# Patient Record
Sex: Female | Born: 1970 | Race: White | Hispanic: No | Smoking: Never smoker
Health system: Southern US, Community
[De-identification: ages and names within clinical notes are randomized; demographics above are authoritative.]

## PROBLEM LIST (undated history)

## (undated) DIAGNOSIS — C50919 Malignant neoplasm of unspecified site of unspecified female breast: Secondary | ICD-10-CM

## (undated) DIAGNOSIS — E119 Type 2 diabetes mellitus without complications: Secondary | ICD-10-CM

## (undated) HISTORY — PX: THYROIDECTOMY: SHX17

## (undated) HISTORY — DX: Type 2 diabetes mellitus without complications: E11.9

## (undated) HISTORY — DX: Malignant neoplasm of unspecified site of unspecified female breast: C50.919

---

## 1998-09-30 ENCOUNTER — Inpatient Hospital Stay (HOSPITAL_COMMUNITY): Admission: AD | Admit: 1998-09-30 | Discharge: 1998-09-30 | Payer: Self-pay | Admitting: Obstetrics and Gynecology

## 1998-10-01 ENCOUNTER — Inpatient Hospital Stay (HOSPITAL_COMMUNITY): Admission: AD | Admit: 1998-10-01 | Discharge: 1998-10-01 | Payer: Self-pay | Admitting: *Deleted

## 1999-02-20 ENCOUNTER — Inpatient Hospital Stay (HOSPITAL_COMMUNITY): Admission: AD | Admit: 1999-02-20 | Discharge: 1999-02-20 | Payer: Self-pay | Admitting: Obstetrics and Gynecology

## 1999-03-15 ENCOUNTER — Inpatient Hospital Stay (HOSPITAL_COMMUNITY): Admission: AD | Admit: 1999-03-15 | Discharge: 1999-03-17 | Payer: Self-pay | Admitting: Obstetrics and Gynecology

## 1999-05-28 ENCOUNTER — Other Ambulatory Visit: Admission: RE | Admit: 1999-05-28 | Discharge: 1999-05-28 | Payer: Self-pay | Admitting: Obstetrics and Gynecology

## 1999-05-28 ENCOUNTER — Encounter (INDEPENDENT_AMBULATORY_CARE_PROVIDER_SITE_OTHER): Payer: Self-pay | Admitting: Specialist

## 2000-01-22 ENCOUNTER — Other Ambulatory Visit: Admission: RE | Admit: 2000-01-22 | Discharge: 2000-01-22 | Payer: Self-pay | Admitting: Obstetrics and Gynecology

## 2000-03-01 ENCOUNTER — Emergency Department (HOSPITAL_COMMUNITY): Admission: EM | Admit: 2000-03-01 | Discharge: 2000-03-01 | Payer: Self-pay | Admitting: Emergency Medicine

## 2000-07-14 ENCOUNTER — Encounter: Payer: Self-pay | Admitting: Obstetrics & Gynecology

## 2000-07-14 ENCOUNTER — Inpatient Hospital Stay (HOSPITAL_COMMUNITY): Admission: AD | Admit: 2000-07-14 | Discharge: 2000-07-14 | Payer: Self-pay | Admitting: Obstetrics & Gynecology

## 2000-08-09 ENCOUNTER — Inpatient Hospital Stay (HOSPITAL_COMMUNITY): Admission: AD | Admit: 2000-08-09 | Discharge: 2000-08-09 | Payer: Self-pay | Admitting: Obstetrics and Gynecology

## 2000-08-15 ENCOUNTER — Inpatient Hospital Stay (HOSPITAL_COMMUNITY): Admission: AD | Admit: 2000-08-15 | Discharge: 2000-08-15 | Payer: Self-pay | Admitting: Obstetrics and Gynecology

## 2000-08-28 ENCOUNTER — Inpatient Hospital Stay (HOSPITAL_COMMUNITY): Admission: AD | Admit: 2000-08-28 | Discharge: 2000-08-30 | Payer: Self-pay | Admitting: Obstetrics and Gynecology

## 2001-02-03 ENCOUNTER — Other Ambulatory Visit: Admission: RE | Admit: 2001-02-03 | Discharge: 2001-02-03 | Payer: Self-pay | Admitting: Obstetrics and Gynecology

## 2001-02-28 ENCOUNTER — Ambulatory Visit (HOSPITAL_COMMUNITY): Admission: RE | Admit: 2001-02-28 | Discharge: 2001-02-28 | Payer: Self-pay | Admitting: Gastroenterology

## 2002-01-05 ENCOUNTER — Other Ambulatory Visit: Admission: RE | Admit: 2002-01-05 | Discharge: 2002-01-05 | Payer: Self-pay | Admitting: Obstetrics and Gynecology

## 2002-11-05 ENCOUNTER — Other Ambulatory Visit: Admission: RE | Admit: 2002-11-05 | Discharge: 2002-11-05 | Payer: Self-pay | Admitting: Obstetrics and Gynecology

## 2003-05-05 ENCOUNTER — Encounter: Payer: Self-pay | Admitting: *Deleted

## 2003-05-05 ENCOUNTER — Inpatient Hospital Stay (HOSPITAL_COMMUNITY): Admission: AD | Admit: 2003-05-05 | Discharge: 2003-05-05 | Payer: Self-pay | Admitting: Obstetrics and Gynecology

## 2003-05-27 ENCOUNTER — Inpatient Hospital Stay (HOSPITAL_COMMUNITY): Admission: AD | Admit: 2003-05-27 | Discharge: 2003-05-29 | Payer: Self-pay | Admitting: *Deleted

## 2003-06-05 ENCOUNTER — Ambulatory Visit (HOSPITAL_COMMUNITY): Admission: RE | Admit: 2003-06-05 | Discharge: 2003-06-05 | Payer: Self-pay | Admitting: Obstetrics and Gynecology

## 2003-06-05 ENCOUNTER — Encounter (INDEPENDENT_AMBULATORY_CARE_PROVIDER_SITE_OTHER): Payer: Self-pay | Admitting: Specialist

## 2003-07-01 ENCOUNTER — Other Ambulatory Visit: Admission: RE | Admit: 2003-07-01 | Discharge: 2003-07-01 | Payer: Self-pay | Admitting: Obstetrics and Gynecology

## 2003-09-23 ENCOUNTER — Encounter: Admission: RE | Admit: 2003-09-23 | Discharge: 2003-09-23 | Payer: Self-pay | Admitting: Obstetrics and Gynecology

## 2003-12-02 ENCOUNTER — Encounter: Admission: RE | Admit: 2003-12-02 | Discharge: 2003-12-02 | Payer: Self-pay | Admitting: Obstetrics and Gynecology

## 2005-01-04 ENCOUNTER — Encounter: Admission: RE | Admit: 2005-01-04 | Discharge: 2005-01-04 | Payer: Self-pay | Admitting: Obstetrics and Gynecology

## 2006-01-17 ENCOUNTER — Encounter: Admission: RE | Admit: 2006-01-17 | Discharge: 2006-01-17 | Payer: Self-pay | Admitting: Obstetrics and Gynecology

## 2006-01-19 ENCOUNTER — Encounter: Admission: RE | Admit: 2006-01-19 | Discharge: 2006-01-19 | Payer: Self-pay | Admitting: Obstetrics and Gynecology

## 2006-02-14 ENCOUNTER — Encounter: Admission: RE | Admit: 2006-02-14 | Discharge: 2006-02-14 | Payer: Self-pay | Admitting: Obstetrics and Gynecology

## 2007-05-09 ENCOUNTER — Encounter: Admission: RE | Admit: 2007-05-09 | Discharge: 2007-05-09 | Payer: Self-pay | Admitting: Obstetrics and Gynecology

## 2007-05-19 ENCOUNTER — Encounter: Admission: RE | Admit: 2007-05-19 | Discharge: 2007-05-19 | Payer: Self-pay | Admitting: Obstetrics and Gynecology

## 2007-06-02 ENCOUNTER — Encounter (INDEPENDENT_AMBULATORY_CARE_PROVIDER_SITE_OTHER): Payer: Self-pay | Admitting: Diagnostic Radiology

## 2007-06-02 ENCOUNTER — Encounter: Admission: RE | Admit: 2007-06-02 | Discharge: 2007-06-02 | Payer: Self-pay | Admitting: Obstetrics and Gynecology

## 2007-06-05 HISTORY — PX: BREAST BIOPSY: SHX20

## 2008-01-15 ENCOUNTER — Encounter: Admission: RE | Admit: 2008-01-15 | Discharge: 2008-01-15 | Payer: Self-pay | Admitting: Obstetrics and Gynecology

## 2008-06-05 ENCOUNTER — Encounter: Admission: RE | Admit: 2008-06-05 | Discharge: 2008-06-05 | Payer: Self-pay | Admitting: Obstetrics and Gynecology

## 2009-06-06 ENCOUNTER — Encounter: Admission: RE | Admit: 2009-06-06 | Discharge: 2009-06-06 | Payer: Self-pay | Admitting: Obstetrics and Gynecology

## 2009-06-10 ENCOUNTER — Encounter: Admission: RE | Admit: 2009-06-10 | Discharge: 2009-06-10 | Payer: Self-pay | Admitting: Obstetrics and Gynecology

## 2010-06-28 HISTORY — PX: BREAST BIOPSY: SHX20

## 2010-06-29 ENCOUNTER — Encounter: Admission: RE | Admit: 2010-06-29 | Discharge: 2010-06-29 | Payer: Self-pay | Admitting: Obstetrics and Gynecology

## 2010-07-01 ENCOUNTER — Encounter: Admission: RE | Admit: 2010-07-01 | Discharge: 2010-07-01 | Payer: Self-pay | Admitting: Obstetrics and Gynecology

## 2010-11-08 ENCOUNTER — Encounter: Payer: Self-pay | Admitting: Obstetrics and Gynecology

## 2010-11-09 ENCOUNTER — Encounter: Payer: Self-pay | Admitting: Obstetrics and Gynecology

## 2011-01-29 ENCOUNTER — Other Ambulatory Visit: Payer: Self-pay | Admitting: Obstetrics and Gynecology

## 2011-01-29 ENCOUNTER — Other Ambulatory Visit: Payer: Self-pay | Admitting: Gynecology

## 2011-01-29 DIAGNOSIS — Z803 Family history of malignant neoplasm of breast: Secondary | ICD-10-CM

## 2011-01-29 DIAGNOSIS — Z9889 Other specified postprocedural states: Secondary | ICD-10-CM

## 2011-03-05 NOTE — Procedures (Signed)
[place]. [facility]  Patient:    Victoria Garza, Victoria Garza                    MRN: [id number] Proc. Date: 02/28/01 Adm. Date:  [id number] Attending:  Charna Elizabeth CC:         Sheronette A. Cherly Hensen, M.D.   Procedure Report  DATE OF BIRTH:  [birth year]  PROCEDURE PERFORMED:  Flexible sigmoidoscopy up to 60 cm.  ENDOSCOPIST:  Anselmo Rod, M.D.  INSTRUMENT USED:  Olympus video colonoscope.  INDICATION FOR PROCEDURE:  A 40 year old white female with a history of rectal bleeding.  Rule out proctitis or left-sided colitis.  PREPROCEDURE PREPARATION:  Informed consent was procured from the patient. The patient was fasting for eight hours prior to the procedure and prepped with two Fleets enemas the morning of the procedure.  PREPROCEDURE PHYSICAL:  VITAL SIGNS:  Stable.  NECK:  Supple.  CHEST:  Clear to auscultation.   S1, S2 regular.  ABDOMEN:  Soft with normal abdominal bowel sounds.  DESCRIPTION OF THE PROCEDURE:  The patient was placed in the left lateral decubitus position and sedated with 20 mg of Demerol and 3 mg of Versed intravenous.  Once the patient was adequately sedated and maintained on low-flow oxygen and continuous cardiac monitoring, the Olympus video colonoscope was advanced to the rectum to 60 cm without difficulty except for a small internal hemorrhoid.  No other abnormalities were appreciated.  The patient tolerated the procedure well without complications.  The entire colonic mucosa and the rectum and left colon appeared normal and without lesions.  IMPRESSION:  Normal flexible sigmoidoscopy up to 60 cm except for small internal hemorrhoids.  RECOMMENDATIONS:  The patient has been advised to increase the fluid and fiber in diet and follow up in the office in the next two weeks. DD:  02/28/01 TD:  02/28/01 Job: [id number]

## 2011-03-05 NOTE — Op Note (Signed)
NAME:  Victoria Garza, Victoria Garza                       ACCOUNT NO.:  [id number]   MEDICAL RECORD NO.:  [id number]                   PATIENT TYPE:  AMB   LOCATION:  SDC                                  FACILITY:  [facility]   PHYSICIAN:  Maxie Better, M.D.            DATE OF BIRTH:  [birth year]   DATE OF PROCEDURE:  06/05/2003  DATE OF DISCHARGE:                                 OPERATIVE REPORT   PREOPERATIVE DIAGNOSES:  1. Hematometra.  2. Rule out retained placental tissue.   PROCEDURE:  Suction dilation and curettage.   POSTOPERATIVE DIAGNOSES:  1. Hematometra.  2. Rule out retained placental tissue.   ANESTHESIA:  MAC, paracervical block.   SURGEON:  Maxie Better, M.D.   INDICATIONS:  This is a 40 year old gravida 3, para 3, female, who is status  post uncomplicated vaginal delivery nine days ago, who presented two days  ago to the office with complaint of a temperature of 101.5 and who on  further evaluation today was found to have an endometrial cavity that was  filled with probable blood.  The patient now presents for surgical  evaluation.  She had been placed on Keflex on June 03, 2003, secondary to  puerperal fever of unknown etiology.  Urinalysis had been negative at that  time.  The patient's uterine exam was nontender.  Her breasts were engorged  without any erythema.  There was no focal finding.  Her white count was  mildly increased at 11.5.  The patient reports not still feeling any better  and still having intermittent fever; therefore, an ultrasound was performed  which then revealed the above findings and the necessity for surgical  intervention.  She had had minimal bleeding since her delivery.  Risks and  benefits of the procedure had been explained to the patient.  Two grams of  intravenous Ancef were started.  The patient was transferred to the  operating room.   DESCRIPTION OF PROCEDURE:  Under adequate monitored anesthesia, the patient  was placed in  the dorsal lithotomy position.  She was sterilely prepped and  draped in the usual fashion.  The bladder was catheterized of a small amount  of urine.  Bimanual examination revealed about a 12-week anteverted uterus,  fingertip dilated cervical os, no adnexal masses could be appreciated.  A  bivalve speculum was placed in the vagina, 20 mL of 1% Nesacaine was  injected paracervically at 3 and 9 o'clock.  The anterior lip of the cervix  was grasped with a ring clamp.  The cervix easily accepted a #29 Pratt  dilator and a #10 curved suction cannula was introduced into the uterine  cavity.  Prior to placing the cannula, there was brick-colored bloody fluid  suggestive of possibly blood mixed with pus; however, there was no odor to  this fluid.  The cavity was suctioned, curetted, and suctioned.  When the  uterine cavity was felt to have not had  any evidence of any tissue, all  instruments were then removed from the vagina.   SPECIMENS:  The endometrial curettings sent to pathology.   ESTIMATED BLOOD LOSS:  Minimal.    COMPLICATIONS:  None.   The patient tolerated the procedure well, was transferred to the recovery  room in stable condition.                                                Maxie Better, M.D.    [place]/MEDQ  D:  06/05/2003  T:  06/06/2003  Job:  [id number]

## 2011-06-24 ENCOUNTER — Other Ambulatory Visit: Payer: Self-pay | Admitting: Obstetrics and Gynecology

## 2011-06-24 DIAGNOSIS — Z1231 Encounter for screening mammogram for malignant neoplasm of breast: Secondary | ICD-10-CM

## 2011-07-21 ENCOUNTER — Ambulatory Visit: Payer: Self-pay

## 2011-08-04 ENCOUNTER — Ambulatory Visit: Payer: Self-pay

## 2011-08-11 ENCOUNTER — Ambulatory Visit
Admission: RE | Admit: 2011-08-11 | Discharge: 2011-08-11 | Disposition: A | Discharge status: Home / Self Care | Payer: PRIVATE HEALTH INSURANCE | Source: Ambulatory Visit | Attending: Obstetrics and Gynecology | Admitting: Obstetrics and Gynecology

## 2011-08-11 DIAGNOSIS — Z1231 Encounter for screening mammogram for malignant neoplasm of breast: Secondary | ICD-10-CM

## 2012-07-10 ENCOUNTER — Other Ambulatory Visit: Payer: Self-pay | Admitting: Obstetrics and Gynecology

## 2012-07-10 DIAGNOSIS — Z1231 Encounter for screening mammogram for malignant neoplasm of breast: Secondary | ICD-10-CM

## 2012-08-14 ENCOUNTER — Ambulatory Visit: Payer: PRIVATE HEALTH INSURANCE

## 2012-10-26 ENCOUNTER — Ambulatory Visit: Payer: PRIVATE HEALTH INSURANCE

## 2012-11-30 ENCOUNTER — Ambulatory Visit: Payer: PRIVATE HEALTH INSURANCE

## 2012-12-21 ENCOUNTER — Ambulatory Visit: Payer: PRIVATE HEALTH INSURANCE

## 2013-03-05 ENCOUNTER — Other Ambulatory Visit: Payer: Self-pay | Admitting: Family Medicine

## 2013-03-05 DIAGNOSIS — N644 Mastodynia: Secondary | ICD-10-CM

## 2013-03-19 ENCOUNTER — Ambulatory Visit
Admission: RE | Admit: 2013-03-19 | Discharge: 2013-03-19 | Disposition: A | Discharge status: Home / Self Care | Payer: PRIVATE HEALTH INSURANCE | Source: Ambulatory Visit | Attending: Family Medicine | Admitting: Family Medicine

## 2013-03-19 DIAGNOSIS — N644 Mastodynia: Secondary | ICD-10-CM

## 2013-03-27 ENCOUNTER — Other Ambulatory Visit: Payer: Self-pay | Admitting: Obstetrics and Gynecology

## 2013-03-27 DIAGNOSIS — Z803 Family history of malignant neoplasm of breast: Secondary | ICD-10-CM

## 2013-04-04 ENCOUNTER — Other Ambulatory Visit: Payer: PRIVATE HEALTH INSURANCE

## 2014-05-03 ENCOUNTER — Other Ambulatory Visit: Payer: Self-pay

## 2014-05-03 DIAGNOSIS — Z1231 Encounter for screening mammogram for malignant neoplasm of breast: Secondary | ICD-10-CM

## 2014-06-21 ENCOUNTER — Ambulatory Visit: Payer: PRIVATE HEALTH INSURANCE

## 2014-07-11 ENCOUNTER — Ambulatory Visit
Admission: RE | Admit: 2014-07-11 | Discharge: 2014-07-11 | Disposition: A | Discharge status: Home / Self Care | Payer: No Typology Code available for payment source | Source: Ambulatory Visit

## 2014-07-11 ENCOUNTER — Encounter (INDEPENDENT_AMBULATORY_CARE_PROVIDER_SITE_OTHER): Payer: Self-pay

## 2014-07-11 DIAGNOSIS — Z1231 Encounter for screening mammogram for malignant neoplasm of breast: Secondary | ICD-10-CM

## 2015-07-28 ENCOUNTER — Other Ambulatory Visit: Payer: Self-pay

## 2015-07-28 DIAGNOSIS — Z1231 Encounter for screening mammogram for malignant neoplasm of breast: Secondary | ICD-10-CM

## 2015-08-27 ENCOUNTER — Ambulatory Visit: Payer: No Typology Code available for payment source

## 2017-12-08 ENCOUNTER — Other Ambulatory Visit: Payer: Self-pay | Admitting: Obstetrics and Gynecology

## 2017-12-08 DIAGNOSIS — N6489 Other specified disorders of breast: Secondary | ICD-10-CM

## 2017-12-09 ENCOUNTER — Ambulatory Visit
Admission: RE | Admit: 2017-12-09 | Discharge: 2017-12-09 | Disposition: A | Discharge status: Home / Self Care | Payer: BLUE CROSS/BLUE SHIELD | Source: Ambulatory Visit | Attending: Obstetrics and Gynecology | Admitting: Obstetrics and Gynecology

## 2017-12-09 DIAGNOSIS — N6489 Other specified disorders of breast: Secondary | ICD-10-CM

## 2018-06-23 ENCOUNTER — Emergency Department (HOSPITAL_BASED_OUTPATIENT_CLINIC_OR_DEPARTMENT_OTHER)
Admission: EM | Admit: 2018-06-23 | Discharge: 2018-06-23 | Disposition: A | Discharge status: Other Acute Inpatient Hospital | Payer: BLUE CROSS/BLUE SHIELD | Attending: Emergency Medicine | Admitting: Emergency Medicine

## 2018-06-23 ENCOUNTER — Emergency Department (HOSPITAL_BASED_OUTPATIENT_CLINIC_OR_DEPARTMENT_OTHER): Payer: BLUE CROSS/BLUE SHIELD

## 2018-06-23 ENCOUNTER — Encounter (HOSPITAL_BASED_OUTPATIENT_CLINIC_OR_DEPARTMENT_OTHER): Payer: Self-pay | Admitting: Emergency Medicine

## 2018-06-23 ENCOUNTER — Other Ambulatory Visit: Payer: Self-pay

## 2018-06-23 DIAGNOSIS — R079 Chest pain, unspecified: Secondary | ICD-10-CM | POA: Diagnosis not present

## 2018-06-23 DIAGNOSIS — M542 Cervicalgia: Secondary | ICD-10-CM | POA: Insufficient documentation

## 2018-06-23 DIAGNOSIS — R42 Dizziness and giddiness: Secondary | ICD-10-CM | POA: Insufficient documentation

## 2018-06-23 DIAGNOSIS — R142 Eructation: Secondary | ICD-10-CM | POA: Insufficient documentation

## 2018-06-23 DIAGNOSIS — R110 Nausea: Secondary | ICD-10-CM | POA: Diagnosis not present

## 2018-06-23 DIAGNOSIS — R200 Anesthesia of skin: Secondary | ICD-10-CM | POA: Insufficient documentation

## 2018-06-23 LAB — COMPREHENSIVE METABOLIC PANEL
ALBUMIN: 4.4 g/dL (ref 3.5–5.0)
ALK PHOS: 38 U/L (ref 38–126)
ALT: 14 U/L (ref 0–44)
ANION GAP: 10 (ref 5–15)
AST: 20 U/L (ref 15–41)
BUN: 16 mg/dL (ref 6–20)
CALCIUM: 8.9 mg/dL (ref 8.9–10.3)
CO2: 23 mmol/L (ref 22–32)
Chloride: 105 mmol/L (ref 98–111)
Creatinine, Ser: 0.78 mg/dL (ref 0.44–1.00)
GFR calc Af Amer: >60 mL/min (ref >60)
GFR calc non Af Amer: >60 mL/min (ref >60)
GLUCOSE: 93 mg/dL (ref 70–99)
Potassium: 3.8 mmol/L (ref 3.5–5.1)
SODIUM: 138 mmol/L (ref 135–145)
Total Bilirubin: 0.9 mg/dL (ref 0.3–1.2)
Total Protein: 7.6 g/dL (ref 6.5–8.1)

## 2018-06-23 LAB — CBC WITH DIFFERENTIAL/PLATELET
BASOS ABS: 0.1 10*3/uL (ref 0.0–0.1)
BASOS PCT: 1 %
EOS ABS: 0.2 10*3/uL (ref 0.0–0.7)
Eosinophils Relative: 2 %
HEMATOCRIT: 41.8 % (ref 36.0–46.0)
HEMOGLOBIN: 14.2 g/dL (ref 12.0–15.0)
Lymphocytes Relative: 29 %
Lymphs Abs: 2 10*3/uL (ref 0.7–4.0)
MCH: 30.4 pg (ref 26.0–34.0)
MCHC: 34 g/dL (ref 30.0–36.0)
MCV: 89.5 fL (ref 78.0–100.0)
Monocytes Absolute: 0.7 10*3/uL (ref 0.1–1.0)
Monocytes Relative: 11 %
NEUTROS ABS: 3.9 10*3/uL (ref 1.7–7.7)
NEUTROS PCT: 57 %
Platelets: 272 10*3/uL (ref 150–400)
RBC: 4.67 MIL/uL (ref 3.87–5.11)
RDW: 12.7 % (ref 11.5–15.5)
WBC: 6.8 10*3/uL (ref 4.0–10.5)

## 2018-06-23 LAB — TROPONIN I
Troponin I: <0.03 ng/mL (ref <0.03)
Troponin I: <0.03 ng/mL (ref <0.03)

## 2018-06-23 LAB — PREGNANCY, URINE: Preg Test, Ur: NEGATIVE

## 2018-06-23 MED ORDER — ACETAMINOPHEN 500 MG PO TABS
1000.0000 mg | ORAL_TABLET | Freq: Once | ORAL | Status: AC
  Administered 2018-06-23: 1000 mg via ORAL
  Filled 2018-06-23: qty 2

## 2018-06-23 MED ORDER — NITROGLYCERIN 0.4 MG SL SUBL
0.4000 mg | SUBLINGUAL_TABLET | SUBLINGUAL | Status: DC | PRN
  Administered 2018-06-23 (×5): 0.4 mg via SUBLINGUAL
  Filled 2018-06-23 (×2): qty 1

## 2018-06-23 MED ORDER — ASPIRIN 81 MG PO CHEW
324.0000 mg | CHEWABLE_TABLET | Freq: Once | ORAL | Status: AC
  Administered 2018-06-23: 324 mg via ORAL
  Filled 2018-06-23: qty 4

## 2018-06-23 NOTE — ED Provider Notes (Signed)
[facility] EMERGENCY DEPARTMENT Provider Note   CSN: [id number] Arrival date & time: 06/23/18  1043     History   Chief Complaint Chief Complaint  Patient presents with  . Chest Pain    HPI Victoria Garza is a 47 y.o. female.  HPI   47 year old female presents with concern for chest pain. Reports feels like a weight on her chest.  Has been over the last month, increasing in frequency, more consistent, had some additional symptoms. Left arm felt numb a few weeks ago, arm was propped up at the time. Felt some tightness in the neck.  Heart history in family.  Last night felt sharp pain laying down, was feeling more like pressure and ball like pain.  Was sitting in video conference and felt like big ball in the chest with radiation to the neck and tongue felt heavy, and felt lightheaded.  Reports walking on Sunday without CP at that time, hasn't noticed any specific exertional pain or pain after eating.  Reports belching but it doesn't help.  Never had hx of acid reflux or indigestion.  No shortness of breath.  Nausea now but did not before. No diaphoresis.  No syncope.  No leg pain or swelling.  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  No hx htn, chol, DM, no smoking, no other drugs Dad had MI around 59s  History reviewed. No pertinent past medical history.  There are no active problems to display for this patient.   Past Surgical History:  Procedure Laterality Date  . BREAST BIOPSY Left 06/05/2007  . BREAST BIOPSY Right 06/28/2010     OB History   None      Home Medications    Prior to Admission medications   Not on File    Family History Family History  Problem Relation Age of Onset  . Breast cancer Sister 27    Social History Social History   Tobacco Use  . Smoking status: Not on file  Substance Use Topics  . Alcohol use: Not on file  . Drug use: Not on file     Allergies   Patient has no known allergies.   Review of  Systems Review of Systems  Constitutional: Negative for fever.  HENT: Negative for sore throat.   Eyes: Negative for visual disturbance.  Respiratory: Negative for cough and shortness of breath.   Cardiovascular: Positive for chest pain.  Gastrointestinal: Positive for nausea. Negative for abdominal pain, diarrhea and vomiting.  Genitourinary: Negative for difficulty urinating.  Musculoskeletal: Negative for back pain and neck pain.  Skin: Negative for rash.  Neurological: Negative for syncope, facial asymmetry, speech difficulty, weakness, numbness and headaches.     Physical Exam Updated Vital Signs BP 113/80 (BP Location: Right Arm)   Pulse 89   Temp 98.6 F (37 C) (Oral)   Resp 16   Ht 5\' 2"  (1.575 m)   Wt 55.3 kg   LMP 06/02/2018 (Approximate)   SpO2 99%   BMI 22.31 kg/m   Physical Exam  Constitutional: She is oriented to person, place, and time. She appears well-developed and well-nourished. No distress.  HENT:  Head: Normocephalic and atraumatic.  Eyes: Conjunctivae and EOM are normal.  Neck: Normal range of motion.  Cardiovascular: Normal rate, regular rhythm, normal heart sounds and intact distal pulses. Exam reveals no gallop and no friction rub.  No murmur heard. Pulmonary/Chest: Effort normal and breath sounds normal. No respiratory distress. She has  no wheezes. She has no rales.  Abdominal: Soft. She exhibits no distension. There is no tenderness. There is no guarding.  Musculoskeletal: She exhibits no edema or tenderness.  Neurological: She is alert and oriented to person, place, and time. She has normal strength. No cranial nerve deficit or sensory deficit. Coordination normal. GCS eye subscore is 4. GCS verbal subscore is 5. GCS motor subscore is 6.  Skin: Skin is warm and dry. No rash noted. She is not diaphoretic. No erythema.  Nursing note and vitals reviewed.    ED Treatments / Results  Labs (all labs ordered are listed, but only abnormal results  are displayed) Labs Reviewed  CBC WITH DIFFERENTIAL/PLATELET  COMPREHENSIVE METABOLIC PANEL  PREGNANCY, URINE  TROPONIN I  TROPONIN I    EKG EKG Interpretation  Date/Time:  Friday June 23 2018 13:48:15 EDT Ventricular Rate:  75 PR Interval:    QRS Duration: 82 QT Interval:  389 QTC Calculation: 435 R Axis:   47 Text Interpretation:  Sinus rhythm Abnormal R-wave progression, early transition No significant change since last tracing Confirmed by Alvira Monday ([id number]) on 06/23/2018 2:35:27 PM   Radiology Dg Chest 2 View  Result Date: 06/23/2018 CLINICAL DATA:  Intermittent chest pain. EXAM: CHEST - 2 VIEW COMPARISON:  No prior. FINDINGS: Mediastinum Hilar structures normal. Lungs are clear. No pleural effusion or pneumothorax. Heart size normal. No acute bony abnormality. IMPRESSION: No acute cardiopulmonary disease. Electronically Signed   By: Maisie Fus  Register   On: 06/23/2018 11:45    Procedures Procedures (including critical care time)  Medications Ordered in ED Medications  nitroGLYCERIN (NITROSTAT) SL tablet 0.4 mg (0.4 mg Sublingual Given 06/23/18 1312)  acetaminophen (TYLENOL) tablet 1,000 mg (1,000 mg Oral Given 06/23/18 1138)  aspirin chewable tablet 324 mg (324 mg Oral Given 06/23/18 1140)     Initial Impression / Assessment and Plan / ED Course  I have reviewed the triage vital signs and the nursing notes.  Pertinent labs & imaging results that were available during my care of the patient were reviewed by me and considered in my medical decision making (see chart for details).     46 year old female with family history of coronary artery disease presents with concern for chest pain radiating to the neck and jaw.  EKG shows abnormal R wave progression, without other acute abnormalities, and is unchanged on repeat.  Troponin negative x2.  Have low suspicion for pulmonary embolus given patient denies shortness of breath, is not on estrogen, no tachycardia, no  tachypnea, no asymmetric leg swelling, and has not had any long flights or travel.  Doubt dissection given normal blood pressures, normal pulses in the upper and lower extremities. Reported bilateral tongue sensation changes, doubt CVA, normal neuro exam.   Patient's chest pain was relieved after receiving 3 sublingual nitro. Given ASA.  Her heart score is 4-5 given historical factors and family history.  Given this, I offered admission.  Patient prefers admission to [facility], accepted to hospitalist and Cardiology to consult.  Final Clinical Impressions(s) / ED Diagnoses   Final diagnoses:  Chest pain, unspecified type    ED Discharge Orders    None       Alvira Monday, MD 06/23/18 1733

## 2018-06-23 NOTE — ED Notes (Signed)
Denies chest pain at present.  States she will let staff know if she begins to have CP.  Nitro held.

## 2018-06-23 NOTE — ED Triage Notes (Addendum)
Reports intermittent chest pain x 1 month. Reports that feels like a weight is on her chest but that she occasionally has periods of sharp pain.  Reports that she was flying last night and the pain was very heavy across the chest.  Denies shortness of breath but sits for extended periods.  Denies nausea and vomiting.  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 Reports episode today approximately 20 minutes PTA of right side of jaw and tongue numbness.  States that this "flushed to my legs and I became really weak and felt like I was going to pass out".

## 2018-06-23 NOTE — ED Notes (Signed)
Patient states pain 1/10; states it's more of a "casual pain" now.

## 2018-06-23 NOTE — ED Notes (Signed)
ED Provider at bedside. 

## 2018-10-08 IMAGING — CR DG CHEST 2V
2 series · 2 of 2 positions shown · non-contrast
Comparison: No prior.

CLINICAL DATA: Intermittent chest pain.

EXAM:
CHEST - 2 VIEW

[w chest pa]
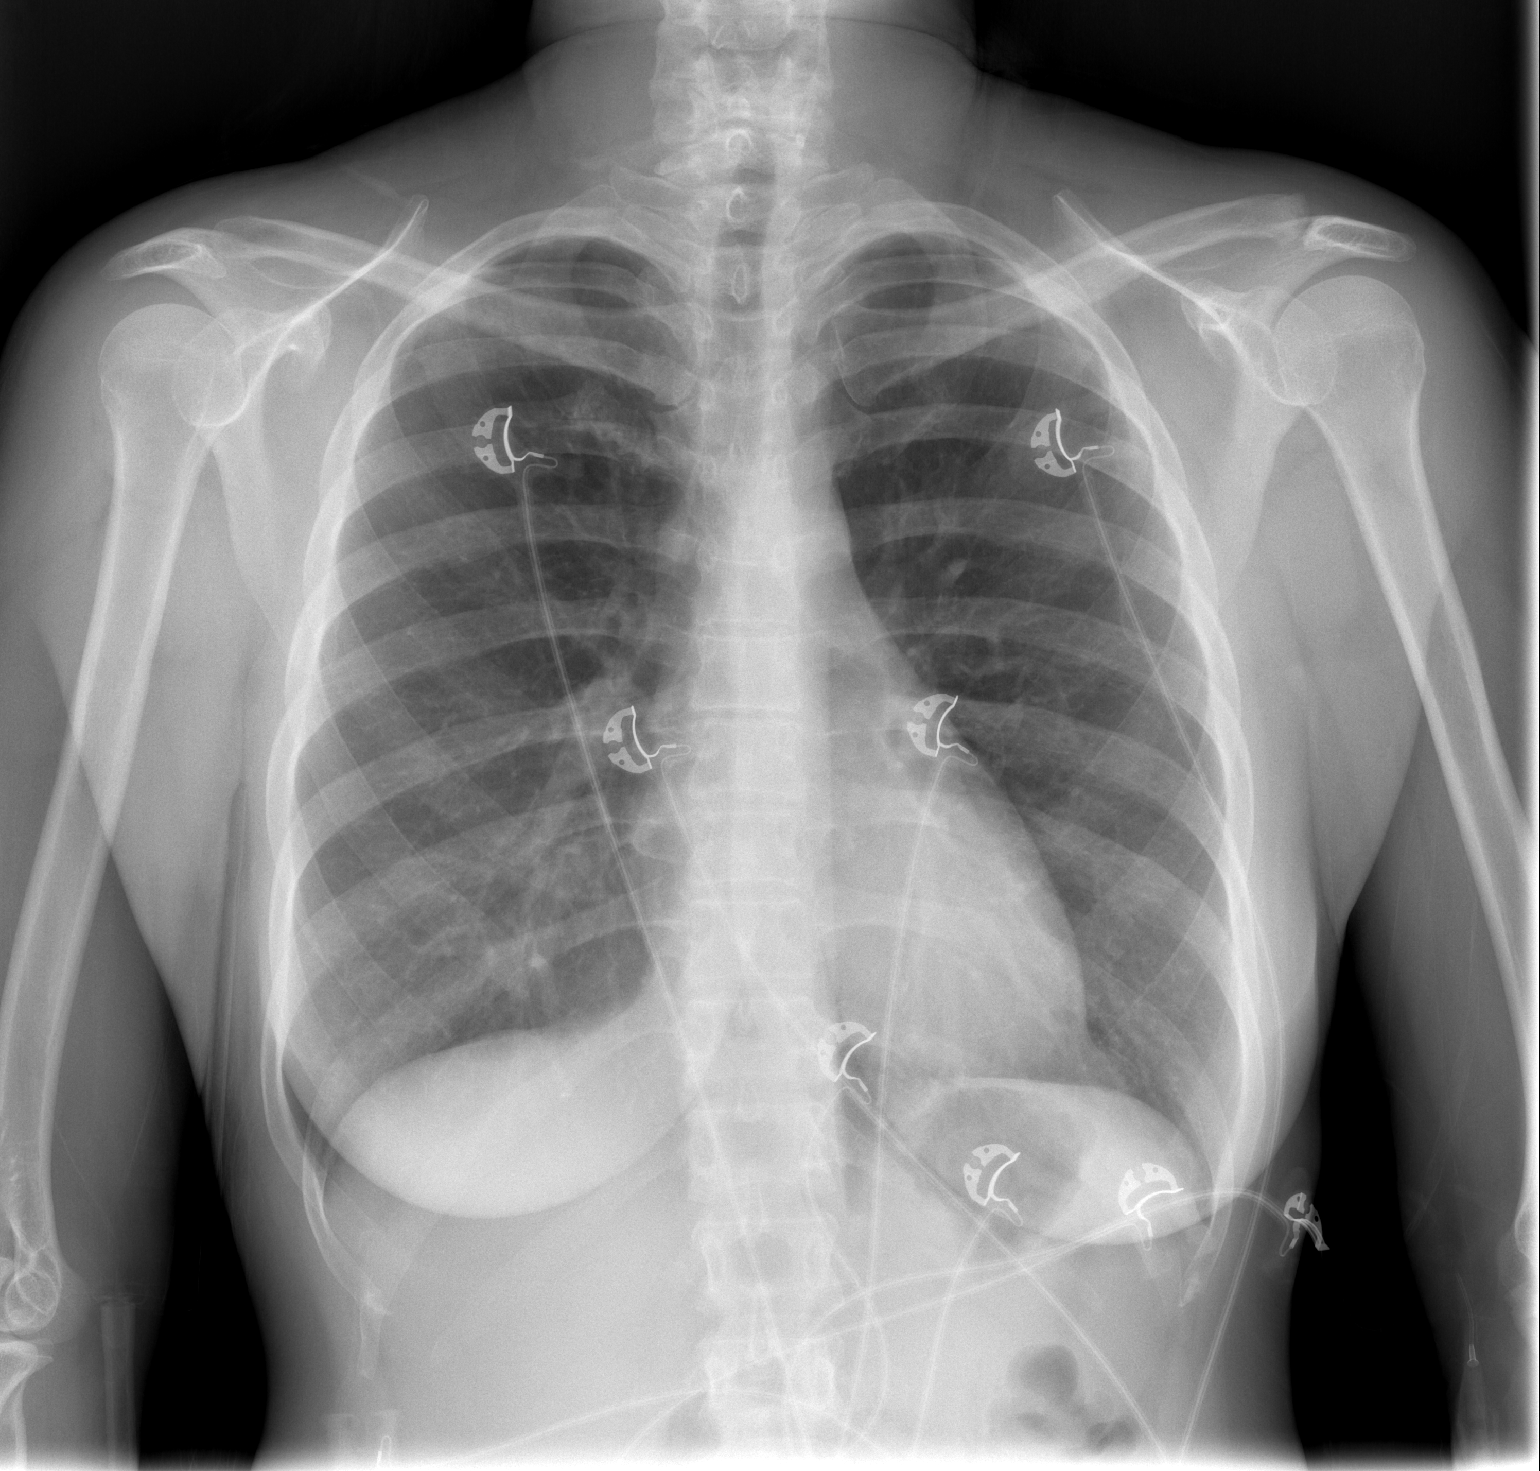

[w chest lat]
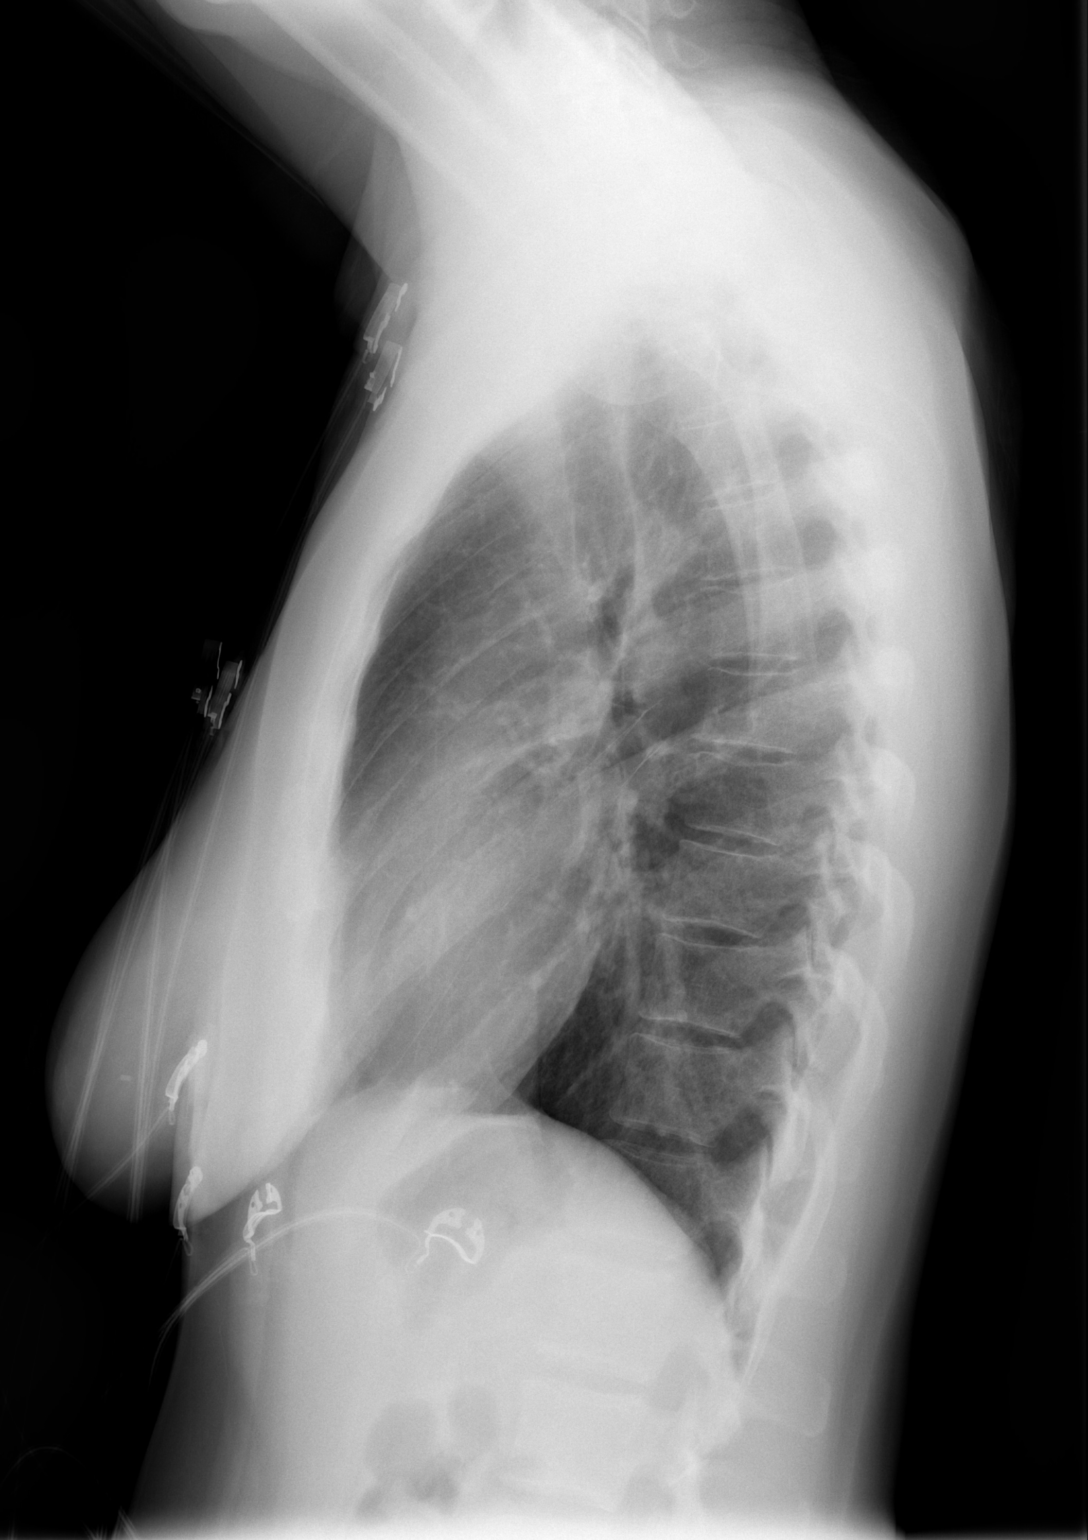

[2 of 2 positions shown; findings below may reference images not displayed]

FINDINGS: Mediastinum

Hilar structures normal. Lungs are clear. No pleural effusion or
pneumothorax. Heart size normal. No acute bony abnormality.
IMPRESSION: No acute cardiopulmonary disease.

## 2023-05-07 LAB — COLOGUARD: COLOGUARD: NEGATIVE

## 2023-07-20 ENCOUNTER — Other Ambulatory Visit: Payer: Self-pay

## 2023-07-22 LAB — SURGICAL PATHOLOGY

## 2023-07-25 ENCOUNTER — Encounter: Payer: Self-pay | Admitting: Genetic Counselor

## 2023-07-25 ENCOUNTER — Encounter: Payer: Self-pay | Admitting: *Deleted

## 2023-07-25 ENCOUNTER — Telehealth: Payer: Self-pay | Admitting: *Deleted

## 2023-07-25 DIAGNOSIS — Z1379 Encounter for other screening for genetic and chromosomal anomalies: Secondary | ICD-10-CM | POA: Insufficient documentation

## 2023-07-25 NOTE — Telephone Encounter (Signed)
Spoke with patient to confirm [facility] appt for 07/27/23 at 12:15pm. Information given along with contact info.

## 2023-07-26 ENCOUNTER — Other Ambulatory Visit: Payer: Self-pay | Admitting: *Deleted

## 2023-07-26 DIAGNOSIS — C50411 Malignant neoplasm of upper-outer quadrant of right female breast: Secondary | ICD-10-CM | POA: Insufficient documentation

## 2023-07-27 ENCOUNTER — Inpatient Hospital Stay (HOSPITAL_BASED_OUTPATIENT_CLINIC_OR_DEPARTMENT_OTHER): Payer: 59 | Admitting: Hematology and Oncology

## 2023-07-27 ENCOUNTER — Other Ambulatory Visit: Payer: Self-pay | Admitting: General Surgery

## 2023-07-27 ENCOUNTER — Encounter: Payer: Self-pay | Admitting: Hematology and Oncology

## 2023-07-27 ENCOUNTER — Inpatient Hospital Stay: Payer: 59 | Attending: Hematology and Oncology

## 2023-07-27 ENCOUNTER — Other Ambulatory Visit: Payer: Self-pay

## 2023-07-27 ENCOUNTER — Encounter: Payer: Self-pay | Admitting: General Practice

## 2023-07-27 ENCOUNTER — Ambulatory Visit: Payer: 59 | Attending: General Surgery | Admitting: Physical Therapy

## 2023-07-27 ENCOUNTER — Ambulatory Visit
Admission: RE | Admit: 2023-07-27 | Discharge: 2023-07-27 | Disposition: A | Discharge status: Home / Self Care | Payer: BLUE CROSS/BLUE SHIELD | Source: Ambulatory Visit | Attending: Radiation Oncology | Admitting: Radiation Oncology

## 2023-07-27 ENCOUNTER — Encounter: Payer: Self-pay | Admitting: Physical Therapy

## 2023-07-27 VITALS — BP 140/66 | HR 83 | Temp 97.7°F | Resp 18 | Ht 62.0 in | Wt 157.9 lb

## 2023-07-27 DIAGNOSIS — C50411 Malignant neoplasm of upper-outer quadrant of right female breast: Secondary | ICD-10-CM

## 2023-07-27 DIAGNOSIS — Z170 Estrogen receptor positive status [ER+]: Secondary | ICD-10-CM

## 2023-07-27 DIAGNOSIS — R293 Abnormal posture: Secondary | ICD-10-CM | POA: Diagnosis present

## 2023-07-27 LAB — CBC WITH DIFFERENTIAL (CANCER CENTER ONLY)
Abs Immature Granulocytes: 0.03 10*3/uL (ref 0.00–0.07)
Basophils Absolute: 0.1 10*3/uL (ref 0.0–0.1)
Basophils Relative: 1 %
Eosinophils Absolute: 0.2 10*3/uL (ref 0.0–0.5)
Eosinophils Relative: 3 %
HCT: 42.9 % (ref 36.0–46.0)
Hemoglobin: 14.5 g/dL (ref 12.0–15.0)
Immature Granulocytes: 0 %
Lymphocytes Relative: 22 %
Lymphs Abs: 1.7 10*3/uL (ref 0.7–4.0)
MCH: 30.1 pg (ref 26.0–34.0)
MCHC: 33.8 g/dL (ref 30.0–36.0)
MCV: 89.2 fL (ref 80.0–100.0)
Monocytes Absolute: 0.7 10*3/uL (ref 0.1–1.0)
Monocytes Relative: 9 %
Neutro Abs: 5 10*3/uL (ref 1.7–7.7)
Neutrophils Relative %: 65 %
Platelet Count: 339 10*3/uL (ref 150–400)
RBC: 4.81 MIL/uL (ref 3.87–5.11)
RDW: 12.3 % (ref 11.5–15.5)
WBC Count: 7.6 10*3/uL (ref 4.0–10.5)
nRBC: 0 % (ref 0.0–0.2)

## 2023-07-27 LAB — CMP (CANCER CENTER ONLY)
ALT: 15 U/L (ref 0–44)
AST: 18 U/L (ref 15–41)
Albumin: 4.5 g/dL (ref 3.5–5.0)
Alkaline Phosphatase: 62 U/L (ref 38–126)
Anion gap: 6 (ref 5–15)
BUN: 14 mg/dL (ref 6–20)
CO2: 30 mmol/L (ref 22–32)
Calcium: 9.4 mg/dL (ref 8.9–10.3)
Chloride: 104 mmol/L (ref 98–111)
Creatinine: 0.99 mg/dL (ref 0.44–1.00)
GFR, Estimated: >60 mL/min (ref >60)
Glucose, Bld: 108 mg/dL — ABNORMAL HIGH (ref 70–99)
Potassium: 3.6 mmol/L (ref 3.5–5.1)
Sodium: 140 mmol/L (ref 135–145)
Total Bilirubin: 0.5 mg/dL (ref 0.3–1.2)
Total Protein: 7.8 g/dL (ref 6.5–8.1)

## 2023-07-27 NOTE — Progress Notes (Signed)
[facility] Spiritual Care Note  Met with Krystelle and her husband and son in [facility] to introduce Support Center team/resources.  She completed SDOH screening; results follow below.    SDOH Screenings   Food Insecurity: No Food Insecurity (07/27/2023)  Housing: Low Risk  (07/27/2023)  Transportation Needs: No Transportation Needs (07/27/2023)  Utilities: Not At Risk (07/27/2023)  Depression (PHQ2-9): Low Risk  (07/27/2023)  Tobacco Use: Low Risk  (07/27/2023)    Chaplain and patient discussed common feelings and emotions when being diagnosed with cancer, and the importance of support during treatment.  Chaplain informed patient of the support team and support services at [facility].  Chaplain provided contact information and encouraged patient to call with any questions or concerns.  Teyah is very relieved to learn about the scope of her diagnosis and treatment. She describes herself as full of faith and well supported.  Follow up needed: No. Deshanta plans to reach out as needed/desired.   [address] [place], [place], [facility] Pager [phone] Voicemail [phone]

## 2023-07-27 NOTE — Assessment & Plan Note (Signed)
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 After reviewing the pathology in detail, we proceeded to discuss the different treatment options between surgery, radiation, chemotherapy, antiestrogen therapies.  Recommendations: 1. Breast conserving surgery followed by 2. Oncotype DX testing to determine if chemotherapy would be of any benefit followed by 3. Adjuvant radiation therapy followed by 4. Adjuvant antiestrogen therapy  Oncotype counseling: I discussed Oncotype DX test. I explained to the patient that this is a 21 gene panel to evaluate patient tumors DNA to calculate recurrence score. This would help determine whether patient has high risk or low risk breast cancer. She understands that if her tumor was found to be high risk, she would benefit from systemic chemotherapy. If low risk, no need of chemotherapy.  Return to clinic after surgery to discuss final pathology report and then determine if Oncotype DX testing will need to be sent.

## 2023-07-27 NOTE — Therapy (Signed)
OUTPATIENT PHYSICAL THERAPY BREAST CANCER BASELINE EVALUATION   Patient Name: Victoria Garza MRN: [id number] DOB:25-Aug-1971, 52 y.o., female Today's Date: 07/27/2023  END OF SESSION:  PT End of Session - 07/27/23 1641     Visit Number 1    Number of Visits 2    Date for PT Re-Evaluation 09/21/23    PT Start Time 1528    PT Stop Time 1558    PT Time Calculation (min) 30 min    Activity Tolerance Patient tolerated treatment well    Behavior During Therapy [facility] for tasks assessed/performed             Past Medical History:  Diagnosis Date   Breast cancer (HCC)    Diabetes mellitus without complication (HCC)    Past Surgical History:  Procedure Laterality Date   BREAST BIOPSY Left 06/05/2007   BREAST BIOPSY Right 06/28/2010   THYROIDECTOMY     Patient Active Problem List   Diagnosis Date Noted   Malignant neoplasm of upper-outer quadrant of right female breast (HCC) 07/26/2023   Genetic testing 07/25/2023    REFERRING PROVIDER: Dr. Emelia Loron  REFERRING DIAG: Right breast cancer  THERAPY DIAG:  Malignant neoplasm of upper-outer quadrant of right breast in female, estrogen receptor positive (HCC)  Abnormal posture  Rationale for Evaluation and Treatment: Rehabilitation  ONSET DATE: 07/20/2023  SUBJECTIVE:                                                                                                                                                                                           SUBJECTIVE STATEMENT: Patient reports she is here today to be seen by her medical team for her newly diagnosed right breast cancer.   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 It measures 1.1 cm mass with 2.5 cm non-mass enhancement (positive for intermediate grade DCIS with necrosis) and is located in the upper outer quadrant. It is ER/PR positive and HER2 negative with a Ki67 of 10%.   PATIENT  GOALS:   reduce lymphedema risk and learn post op HEP.   PAIN:  Are you having pain? No  PRECAUTIONS: Active CA   RED FLAGS: None   HAND DOMINANCE: left  WEIGHT BEARING RESTRICTIONS: No  FALLS:  Has patient fallen in last 6 months? No  LIVING ENVIRONMENT: Patient lives with: her husband and 53 y.o. 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  COGNITION: Overall cognitive status: Within functional limits for tasks assessed    POSTURE:  Forward head and rounded shoulders posture  UPPER EXTREMITY AROM/PROM:  A/PROM RIGHT   eval   Shoulder extension 52  Shoulder flexion 159  Shoulder abduction 171  Shoulder internal rotation 69  Shoulder external rotation 90    (Blank rows = not tested)  A/PROM LEFT   eval  Shoulder extension 54  Shoulder flexion 155  Shoulder abduction 168  Shoulder internal rotation 56  Shoulder external rotation 80    (Blank rows = not tested)  CERVICAL AROM: All within normal limits  UPPER EXTREMITY STRENGTH: WNL  LYMPHEDEMA ASSESSMENTS (in cm):   LANDMARK RIGHT   eval  10 cm proximal to olecranon process 30.8  Olecranon process 24.8  10 cm proximal to ulnar styloid process 24.2  Just proximal to ulnar styloid process 15.4  Across hand at thumb web space 16.8  At base of 2nd digit 6.2  (Blank rows = not tested)  LANDMARK LEFT   eval  10 cm proximal to olecranon process 30.7  Olecranon process 24.8  10 cm proximal to ulnar styloid process 22.8  Just proximal to ulnar styloid process 15.1  Across hand at thumb web space 16.9  At base of 2nd digit 6  (Blank rows = not tested)  L-DEX LYMPHEDEMA SCREENING:  The patient was assessed using the  L-Dex machine today to produce a lymphedema index baseline score. The patient will be reassessed on a regular basis (typically every 3 months) to obtain new L-Dex scores. If the score is > 6.5 points away from his/her baseline score indicating onset of subclinical lymphedema, it will be recommended to wear a compression garment for 4 weeks, 12 hours per day and then be reassessed. If the score continues to be > 6.5 points from baseline at reassessment, we will initiate lymphedema treatment. Assessing in this manner has a 95% rate of preventing clinically significant lymphedema.   L-DEX FLOWSHEETS - 07/27/23 1600       L-DEX LYMPHEDEMA SCREENING   Measurement Type Unilateral    L-DEX MEASUREMENT EXTREMITY Upper Extremity    POSITION  Standing    DOMINANT SIDE Left    At Risk Side Right    BASELINE SCORE (UNILATERAL) 7.7             QUICK DASH SURVEY:  [name] - 07/27/23 0001     Open a tight or new jar Mild difficulty    Do heavy household chores (wash walls, wash floors) No difficulty    Carry a shopping bag or briefcase No difficulty    Wash your back No difficulty    Use a knife to cut food No difficulty    Recreational activities in which you take some force or impact through your arm, shoulder, or hand (golf, hammering, tennis) No difficulty    During the past week, to what extent has your arm, shoulder or hand problem interfered with your normal social activities with family, friends, neighbors, or groups? Not at all    During the past week, to what extent has your arm, shoulder or hand problem limited your work or other regular daily activities Not at all    Arm, shoulder, or hand pain. None    Tingling (pins and needles) in your arm, shoulder, or hand None    Difficulty Sleeping No difficulty    DASH  Score 2.27 %              PATIENT EDUCATION:  Education details: Lymphedema risk reduction and post op shoulder/posture HEP Person educated: Patient Education  method: Explanation, Demonstration, Handout Education comprehension: Patient verbalized understanding and returned demonstration  HOME EXERCISE PROGRAM: Patient was instructed today in a home exercise program today for post op shoulder range of motion. These included active assist shoulder flexion in sitting, scapular retraction, wall walking with shoulder abduction, and hands behind head external rotation.  She was encouraged to do these twice a day, holding 3 seconds and repeating 5 times when permitted by her physician.   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 It measures 1.1 cm mass with 2.5 cm non-mass enhancement (positive for intermediate grade DCIS with necrosis) and is located in the upper outer quadrant. It is ER/PR positive and HER2 negative with a Ki67 of 10%. Her multidisciplinary medical team met prior to her assessments to determine a recommended treatment plan. She is planning to have a right lumpectomy and sentinel node biopsy followed by Oncotype testing, radiation, and anti-estrogen therapy. She will benefit from a post op PT reassessment to determine needs and from L-Dex screens every 3 months for 2 years to detect subclinical lymphedema.  Pt will benefit from skilled therapeutic intervention to improve on the following deficits: Decreased knowledge of precautions, impaired UE functional use, pain, decreased ROM, postural dysfunction.   PT treatment/interventions: ADL/self-care home management, pt/family education, therapeutic exercise  REHAB POTENTIAL: Excellent  CLINICAL DECISION MAKING: Stable/uncomplicated  EVALUATION COMPLEXITY: Low   GOALS: Goals reviewed with patient? YES  LONG TERM GOALS: (STG=LTG)    Name Target Date Goal status  1 Pt will be able to verbalize understanding of pertinent lymphedema risk reduction practices relevant to her dx specifically related to skin  care.  Baseline:  No knowledge 07/27/2023 Achieved at eval  2 Pt will be able to return demo and/or verbalize understanding of the post op HEP related to regaining shoulder ROM. Baseline:  No knowledge 07/27/2023 Achieved at eval  3 Pt will be able to verbalize understanding of the importance of attending the post op After Breast CA Class for further lymphedema risk reduction education and therapeutic exercise.  Baseline:  No knowledge 07/27/2023 Achieved at eval  4 Pt will demo she has regained full shoulder ROM and function post operatively compared to baselines.  Baseline: See objective measurements taken today. 09/21/2023     PLAN:  PT FREQUENCY/DURATION: EVAL and 1 follow up appointment.   PLAN FOR NEXT SESSION: will reassess 3-4 weeks post op to determine needs.   Patient will follow up at outpatient cancer rehab 3-4 weeks following surgery.  If the patient requires physical therapy at that time, a specific plan will be dictated and sent to the referring physician for approval. The patient was educated today on appropriate basic range of motion exercises to begin post operatively and the importance of attending the After Breast Cancer class following surgery.  Patient was educated today on lymphedema risk reduction practices as it pertains to recommendations that will benefit the patient immediately following surgery.  She verbalized good understanding.    Physical Therapy Information for After Breast Cancer Surgery/Treatment:  Lymphedema is a swelling condition that you may be at risk for in your arm if you have lymph nodes removed from the armpit area.  After a sentinel node biopsy, the risk is approximately 5-9%  and is higher after an axillary node dissection.  There is treatment available for this condition and it is not life-threatening.  Contact your physician or physical therapist with concerns. You may begin the 4 shoulder/posture exercises (see additional sheet) when permitted by  your physician (typically a week after surgery).  If you have drains, you may need to wait until those are removed before beginning range of motion exercises.  A general recommendation is to not lift your arms above shoulder height until drains are removed.  These exercises should be done to your tolerance and gently.  This is not a "no pain/no gain" type of recovery so listen to your body and stretch into the range of motion that you can tolerate, stopping if you have pain.  If you are having immediate reconstruction, ask your plastic surgeon about doing exercises as he or she may want you to wait. We encourage you to attend the free one time ABC (After Breast Cancer) class offered by [facility].  You will learn information related to lymphedema risk, prevention and treatment and additional exercises to regain mobility following surgery.  You can call [phone] for more information.  This is offered the 1st and 3rd Monday of each month.  You only attend the class one time. While undergoing any medical procedure or treatment, try to avoid blood pressure being taken or needle sticks from occurring on the arm on the side of cancer.   This recommendation begins after surgery and continues for the rest of your life.  This may help reduce your risk of getting lymphedema (swelling in your arm). An excellent resource for those seeking information on lymphedema is the National Lymphedema Network's web site. It can be accessed at www.lymphnet.org If you notice swelling in your hand, arm or breast at any time following surgery (even if it is many years from now), please contact your doctor or physical therapist to discuss this.  Lymphedema can be treated at any time but it is easier for you if it is treated early on.  If you feel like your shoulder motion is not returning to normal in a reasonable amount of time, please contact your surgeon or physical therapist.  [facility]  Specialty Rehab [phone]. [address], [address], [place] [address]  ABC CLASS After Breast Cancer Class  After Breast Cancer Class is a specially designed exercise class to assist you in a safe recover after having breast cancer surgery.  In this class you will learn how to get back to full function whether your drains were just removed or if you had surgery a month ago.  This one-time class is held the 1st and 3rd Monday of every month from 11:00 a.m. until 12:00 noon virtually.  This class is FREE and space is limited. For more information or to register for the next available class, call [phone].  Class Goals  Understand specific stretches to improve the flexibility of you chest and shoulder. Learn ways to safely strengthen your upper body and improve your posture. Understand the warning signs of infection and why you may be at risk for an arm infection. Learn about Lymphedema and prevention.  ** You do not attend this class until after surgery.  Drains must be removed to participate  Patient was instructed today in a home exercise program today for post op shoulder range of motion. These included active assist shoulder flexion in sitting, scapular retraction, wall walking with shoulder abduction,  and hands behind head external rotation.  She was encouraged to do these twice a day, holding 3 seconds and repeating 5 times when permitted by her physician.  Bethann Punches, [place] 07/27/23 4:49 PM

## 2023-07-27 NOTE — Progress Notes (Addendum)
Radiation Oncology         [phone]) [phone] ________________________________  Name: Victoria Garza        MRN: [id number]  Date of Service: 07/27/2023 DOB: 11-09-1970  CC:Victoria Hasten, PA-C  Victoria Loron, MD     REFERRING PHYSICIAN: Emelia Loron, MD   DIAGNOSIS: The encounter diagnosis was Malignant neoplasm of upper-outer quadrant of right breast in female, estrogen receptor positive (HCC).   HISTORY OF PRESENT ILLNESS: Victoria Garza is a 52 y.o. female seen in the multidisciplinary breast clinic for a new diagnosis of right breast cancer. The patient was noted to have screening detected architectural changes in the upper outer quadrant of the right breast.  Further diagnostic workup showed a 1 cm mass in the 11 o'clock position of the breast by ultrasound and her axilla was negative for adenopathy.  She underwent a contrast-enhanced mammogram which was negative on the left side but on the right showed the known 1.1 cm mass in the 11 o'clock position as well as a 2.5 cm area of non-mass enhancement, she underwent biopsies on 07/20/23 which showed grade 2 invasive ductal carcinoma with lobular features of the masslike change as well as intermediate grade DCIS with necrosis of the non-mass enhancement.  Her invasive cancer was ER/PR positive, HER2 negative with a Ki-67 of 10%.  She is seen in clinic today to discuss treatment recommendations of her cancer.    PREVIOUS RADIATION THERAPY: No   PAST MEDICAL HISTORY: No past medical history on file.     PAST SURGICAL HISTORY: Past Surgical History:  Procedure Laterality Date   BREAST BIOPSY Left 06/05/2007   BREAST BIOPSY Right 06/28/2010     FAMILY HISTORY:  Family History  Problem Relation Age of Onset   Breast cancer Sister 3     SOCIAL HISTORY:  The patient is married and lives in [place].  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 She is accompanied by her husband and son.    ALLERGIES: Patient has no known  allergies.   MEDICATIONS:  Current Outpatient Medications  Medication Sig Dispense Refill   FLUoxetine (PROZAC) 20 MG capsule Take 20 mg by mouth daily.     levothyroxine (SYNTHROID) 125 MCG tablet Take 125 mcg by mouth daily before breakfast.     loratadine (CLARITIN) 10 MG tablet Take 10 mg by mouth daily. [employer] brand AllerClear     No current facility-administered medications for this encounter.     REVIEW OF SYSTEMS: On review of systems, the patient reports that she is doing well. She is perimenopausal and reports hot flashes which are being treated by fluoxetine. No breast specific complaints are verbalized.      PHYSICAL EXAM:  Wt Readings from Last 3 Encounters:  07/27/23 157 lb 14.4 oz (71.6 kg)  06/23/18 122 lb (55.3 kg)   Temp Readings from Last 3 Encounters:  07/27/23 97.7 F (36.5 C) (Temporal)  06/23/18 98.6 F (37 C) (Oral)   BP Readings from Last 3 Encounters:  07/27/23 (!) 140/66  06/23/18 (!) 129/91   Pulse Readings from Last 3 Encounters:  07/27/23 83  06/23/18 77    In general this is a well appearing caucasian female in no acute distress. She's alert and oriented x4 and appropriate throughout the examination. Cardiopulmonary assessment is negative for acute distress and she exhibits normal effort. Bilateral breast exam is deferred.    ECOG = 1  0 - Asymptomatic (Fully active, able to carry on all predisease  activities without restriction)  1 - Symptomatic but completely ambulatory (Restricted in physically strenuous activity but ambulatory and able to carry out work of a light or sedentary nature. For example, light housework, office work)  2 - Symptomatic, <50% in bed during the day (Ambulatory and capable of all self care but unable to carry out any work activities. Up and about more than 50% of waking hours)  3 - Symptomatic, >50% in bed, but not bedbound (Capable of only limited self-care, confined to bed or chair 50% or more of waking  hours)  4 - Bedbound (Completely disabled. Cannot carry on any self-care. Totally confined to bed or chair)  5 - Death   Santiago Glad MM, Creech RH, Tormey DC, et al. [phone]). "Toxicity and response criteria of the [facility]". Am. Evlyn Clines. Oncol. 5 (6): 649-55    LABORATORY DATA:  Lab Results  Component Value Date   WBC 7.6 07/27/2023   HGB 14.5 07/27/2023   HCT 42.9 07/27/2023   MCV 89.2 07/27/2023   PLT 339 07/27/2023   Lab Results  Component Value Date   NA 140 07/27/2023   K 3.6 07/27/2023   CL 104 07/27/2023   CO2 30 07/27/2023   Lab Results  Component Value Date   ALT 15 07/27/2023   AST 18 07/27/2023   ALKPHOS 62 07/27/2023   BILITOT 0.5 07/27/2023      RADIOGRAPHY: No results found.     IMPRESSION/PLAN: 1. Stage IA, [id number], grade 2 ER/PR positive invasive ductal carcinoma with lobular features of the right breast. Dr. Mitzi Hansen discusses the pathology findings and reviews the nature of early stage breast disease. The consensus from the breast conference includes breast conservation with lumpectomy with  sentinel node biopsy. Dr. Pamelia Hoit recommends Oncotype Dx score to determine a role for systemic therapy. Provided that chemotherapy is not indicated, the patient's course would then be followed by external radiotherapy to the breast  to reduce risks of local recurrence. Dr. Pamelia Hoit would recommend her course then continue with  antiestrogen therapy. We discussed the risks, benefits, short, and long term effects of radiotherapy, as well as the curative intent, and the patient is interested in proceeding. Dr. Mitzi Hansen discusses the delivery and logistics of radiotherapy and anticipates a course of 4 or up to 6 1/2 weeks of radiotherapy to the right breast. We will see her back a few weeks after surgery to discuss the simulation process and anticipate we starting radiotherapy about 4-6 weeks after surgery. Of note she may be interested in radiation in [facility].   2. Possible genetic predisposition to malignancy. The patient's previous genetic testing does not need to be repeated according to our geneticist.    In a visit lasting 60 minutes, greater than 50% of the time was spent face to face reviewing her case, as well as in preparation of, discussing, and coordinating the patient's care.  The above documentation reflects my direct findings during this shared patient visit. Please see the separate note by Dr. Mitzi Hansen on this date for the remainder of the patient's plan of care.    Osker Mason, [facility]    **Disclaimer: This note was dictated with voice recognition software. Similar sounding words can inadvertently be transcribed and this note may contain transcription errors which may not have been corrected upon publication of note.**

## 2023-07-27 NOTE — Progress Notes (Signed)
[facility] CONSULT NOTE  Patient Care Team: Noralee Chars as PCP - Delford Field, RN as Oncology Nurse Navigator Donnelly Angelica, RN as Oncology Nurse Navigator Serena Croissant, MD as Consulting Physician (Hematology and Oncology) Dorothy Puffer, MD as Consulting Physician (Radiation Oncology) Emelia Loron, MD as Consulting Physician (General Surgery)  CHIEF COMPLAINTS/PURPOSE OF CONSULTATION:  Newly diagnosed breast cancer  HISTORY OF PRESENTING ILLNESS:  Victoria Garza is a 52 year old lady with above-mentioned history of screening mammogram detected right breast distortion measuring 1.5 cm.  Contrast-enhanced mammogram detected a 1.1 cm mass with a 2.5 cm non-mass enhancement.  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  She was told this morning the multidiscipline tumor board and she is here today to discuss treatment plan.  I reviewed her records extensively and collaborated the history with the patient.  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, ER 95%, PR 100%, Ki67 10%, HER2 negative      MEDICAL HISTORY:  Past Medical History:  Diagnosis Date   Breast cancer (HCC)    Diabetes mellitus without complication (HCC)     SURGICAL HISTORY: Past Surgical History:  Procedure Laterality Date   BREAST BIOPSY Left 06/05/2007   BREAST BIOPSY Right 06/28/2010   THYROIDECTOMY      SOCIAL  HISTORY: Social History   Socioeconomic History   Marital status: Divorced    Spouse name: Not on file   Number of children: Not on file   Years of education: Not on file   Highest education level: Not on file  Occupational History   Not on file  Tobacco Use   Smoking status: Never   Smokeless tobacco: Never  Substance and Sexual Activity   Alcohol use: Yes    Alcohol/week: 1.0 standard drink of alcohol    Types: 1 Glasses of wine per week   Drug use: Never   Sexual activity: Not on file  Other Topics Concern   Not on file  Social History Narrative   Not on file   Social Determinants of Health   Financial Resource Strain: Not on file  Food Insecurity: Not on file  Transportation Needs: Not on file  Physical Activity: Not on file  Stress: Not on file  Social Connections: Not on file  Intimate Partner Violence: Not on file    FAMILY HISTORY: Family History  Problem Relation Age of Onset   Brain cancer Father    Breast cancer Sister 8    ALLERGIES:  has No Known Allergies.  MEDICATIONS:  Current Outpatient Medications  Medication Sig Dispense Refill   FLUoxetine (PROZAC) 20 MG capsule Take 20 mg by mouth daily.     levothyroxine (SYNTHROID) 125 MCG tablet Take 125 mcg by mouth daily before breakfast.     loratadine (CLARITIN) 10 MG tablet Take 10 mg by mouth daily. [employer] brand AllerClear     No current facility-administered medications for this visit.    REVIEW OF SYSTEMS:   Constitutional: Denies  fevers, chills or abnormal night sweats Breast:  Denies any palpable lumps or discharge All other systems were reviewed with the patient and are negative.  PHYSICAL EXAMINATION: ECOG PERFORMANCE STATUS: 0 - Asymptomatic  Vitals:   07/27/23 1321  BP: (!) 140/66  Pulse: 83  Resp: 18  Temp: 97.7 F (36.5 C)  SpO2: 98%   Filed Weights   07/27/23 1321  Weight: 157 lb 14.4 oz (71.6 kg)    GENERAL:alert, no distress and comfortable    LABORATORY  DATA:  I have reviewed the data as listed Lab Results  Component Value Date   WBC 7.6 07/27/2023   HGB 14.5 07/27/2023   HCT 42.9 07/27/2023   MCV 89.2 07/27/2023   PLT 339 07/27/2023   Lab Results  Component Value Date   NA 140 07/27/2023   K 3.6 07/27/2023   CL 104 07/27/2023   CO2 30 07/27/2023    RADIOGRAPHIC STUDIES: I have personally reviewed the radiological reports and agreed with the findings in the report.  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 After reviewing the pathology in detail, we proceeded to discuss the different treatment options between surgery, radiation, chemotherapy, antiestrogen therapies.  Recommendations: 1. Breast conserving surgery followed by 2. Oncotype DX testing to determine if chemotherapy would be of any benefit followed by 3. Adjuvant radiation therapy followed by 4. Adjuvant antiestrogen therapy  Oncotype counseling: I discussed Oncotype DX test. I explained to the patient that this is a 21 gene panel to evaluate patient tumors DNA to calculate recurrence score. This would help determine whether patient has high risk or low risk breast cancer. She understands that if her tumor was found to be high risk, she would benefit from systemic chemotherapy. If low risk, no need of chemotherapy.  Return to  clinic after surgery to discuss final pathology report and then determine if Oncotype DX testing will need to be sent.  All questions were answered. The patient knows to call the clinic with any problems, questions or concerns.    Tamsen Meek, MD 07/27/23

## 2023-07-27 NOTE — Addendum Note (Signed)
Encounter addended by: Ronny Bacon, PA-C on: 07/27/2023 2:22 PM  Actions taken: Clinical Note Signed

## 2023-08-02 ENCOUNTER — Encounter: Payer: Self-pay | Admitting: *Deleted

## 2023-08-09 ENCOUNTER — Encounter (HOSPITAL_BASED_OUTPATIENT_CLINIC_OR_DEPARTMENT_OTHER): Payer: Self-pay | Admitting: General Surgery

## 2023-08-09 ENCOUNTER — Other Ambulatory Visit: Payer: Self-pay

## 2023-08-12 MED ORDER — ENSURE PRE-SURGERY PO LIQD: 296.0000 mL | Freq: Once | ORAL | Status: DC

## 2023-08-12 MED ORDER — CHLORHEXIDINE GLUCONATE CLOTH 2 % EX PADS: 6.0000 | MEDICATED_PAD | Freq: Once | CUTANEOUS | Status: DC

## 2023-08-12 NOTE — Progress Notes (Signed)

## 2023-08-15 NOTE — H&P (Signed)
52 yof with fh in her sister at age 52 who had screening mm that showed architectural disortion in the ruoq. This is 1.5 cm on mm and by [place] is 1 cm. She had axillary [place] that was negative. CEM was done that shows negative left breast. The right breast has a 1.1 cm mass and then 2.5 cm NME posterior to it. The clips from biopsies are 3.5 cm apart and the mass and posterior extent were biopsied. Biopsy of mass is grade II IMC that is 95% er pos, 100% pr pos, her 2 negative and Ki is 10%. Biopsy of posterior part is int gr dcis. She is here to discuss options.  Genetics were previously negative.   Review of Systems: A complete review of systems was obtained from the patient. I have reviewed this information and discussed as appropriate with the patient. See HPI as well for other ROS.  Review of Systems  All other systems reviewed and are negative.   Medical History: Past Medical History:  Diagnosis Date  History of cancer  Thyroid disease   Patient Active Problem List  Diagnosis  Postoperative hypothyroidism  Malignant neoplasm of upper-outer quadrant of right female breast (CMS/HHS-HCC)   Past Surgical History:  Procedure Laterality Date  Left Breast Lumpectomy Left  2008  Right Breast Biopsy Right  2011    Allergies  Allergen Reactions  Erythromycin Other (See Comments) and Nausea And Vomiting  Patient reports she can tolerate ZITHROMAX   Current Outpatient Medications on File Prior to Visit  Medication Sig Dispense Refill  FLUoxetine (PROZAC) 20 MG capsule Take 20 mg by mouth once daily  levothyroxine (SYNTHROID) 125 MCG tablet Take 125 mcg by mouth once daily   Family History  Problem Relation Age of Onset  Hyperlipidemia (Elevated cholesterol) Father  Breast cancer Sister    Social History   Tobacco Use  Smoking Status Never  Smokeless Tobacco Never  Marital status: Married  Tobacco Use  Smoking status: Never  Smokeless tobacco: Never  Vaping Use  Vaping  status: Never Used  Substance and Sexual Activity  Alcohol use: Not Currently  Drug use: Never   Objective:  Physical Exam Vitals reviewed.  Constitutional:  Appearance: Normal appearance.  Chest:  Breasts: Right: No inverted nipple, mass or nipple discharge.  Left: No inverted nipple, mass or nipple discharge.  Lymphadenopathy:  Upper Body:  Right upper body: No supraclavicular or axillary adenopathy.  Left upper body: No supraclavicular or axillary adenopathy.  Neurological:  Mental Status: She is alert.   Assessment and Plan:   Malignant neoplasm of upper-outer quadrant of right breast in female, estrogen receptor positive (CMS/HHS-HCC)  Right breast seed bracketed lumpectomy, right ax sn biopsy  We discussed the staging and pathophysiology of breast cancer. We discussed all of the different options for treatment for breast cancer including surgery, chemotherapy, radiation therapy, Herceptin, and antiestrogen therapy.  We discussed a sentinel lymph node biopsy as she does not appear to having lymph node involvement right now. We discussed the performance of that with injection of Magtrace. We discussed that there is a chance of having a positive node with a sentinel lymph node biopsy and we will await the permanent pathology to make any other first further decisions in terms of her treatment. We discussed up to a 5% risk lifetime of chronic shoulder pain as well as lymphedema associated with a sentinel lymph node biopsy.  We discussed the options for treatment of the breast cancer which included lumpectomy versus  a mastectomy. We discussed the performance of bracketed lumpectomy with radioactive seed placement. We discussed a 5-10% chance of a positive margin requiring reexcision in the operating room. We also discussed that she will need radiation therapy if she undergoes lumpectomy. We discussed mastectomy and the postoperative care for that as well. Mastectomy can be followed by  reconstruction. The decision for lumpectomy vs mastectomy has no impact on decision for chemotherapy. Most mastectomy patients will not need radiation therapy. We discussed that there is no difference in her survival whether she undergoes lumpectomy with radiation therapy or antiestrogen therapy versus a mastectomy. There is also no real difference between her recurrence in the breast. \ We discussed the risks of operation including bleeding, infection, possible reoperation. She understands her further therapy will be based on what her stages at the time of her operation.

## 2023-08-16 ENCOUNTER — Ambulatory Visit (HOSPITAL_BASED_OUTPATIENT_CLINIC_OR_DEPARTMENT_OTHER)
Admission: RE | Admit: 2023-08-16 | Discharge: 2023-08-16 | Disposition: A | Discharge status: Home / Self Care | Payer: 59 | Attending: General Surgery | Admitting: General Surgery

## 2023-08-16 ENCOUNTER — Other Ambulatory Visit: Payer: Self-pay

## 2023-08-16 ENCOUNTER — Ambulatory Visit (HOSPITAL_BASED_OUTPATIENT_CLINIC_OR_DEPARTMENT_OTHER): Payer: 59 | Admitting: Certified Registered"

## 2023-08-16 ENCOUNTER — Encounter (HOSPITAL_BASED_OUTPATIENT_CLINIC_OR_DEPARTMENT_OTHER)
Admission: RE | Disposition: A | Discharge status: Home / Self Care | Payer: Self-pay | Source: Home / Self Care | Attending: General Surgery

## 2023-08-16 ENCOUNTER — Encounter (HOSPITAL_BASED_OUTPATIENT_CLINIC_OR_DEPARTMENT_OTHER): Payer: Self-pay | Admitting: General Surgery

## 2023-08-16 DIAGNOSIS — C50411 Malignant neoplasm of upper-outer quadrant of right female breast: Secondary | ICD-10-CM | POA: Diagnosis present

## 2023-08-16 DIAGNOSIS — C50911 Malignant neoplasm of unspecified site of right female breast: Secondary | ICD-10-CM | POA: Diagnosis not present

## 2023-08-16 DIAGNOSIS — Z01818 Encounter for other preprocedural examination: Secondary | ICD-10-CM

## 2023-08-16 DIAGNOSIS — Z170 Estrogen receptor positive status [ER+]: Secondary | ICD-10-CM | POA: Insufficient documentation

## 2023-08-16 HISTORY — PX: BREAST LUMPECTOMY WITH RADIOACTIVE SEED AND SENTINEL LYMPH NODE BIOPSY: SHX6550

## 2023-08-16 LAB — POCT PREGNANCY, URINE: Preg Test, Ur: NEGATIVE

## 2023-08-16 SURGERY — BREAST LUMPECTOMY WITH RADIOACTIVE SEED AND SENTINEL LYMPH NODE BIOPSY
Anesthesia: General | Site: Breast | Laterality: Right

## 2023-08-16 MED ORDER — LIDOCAINE HCL (CARDIAC) PF 100 MG/5ML IV SOSY
PREFILLED_SYRINGE | INTRAVENOUS | Status: DC | PRN
  Administered 2023-08-16: 40 mg via INTRAVENOUS

## 2023-08-16 MED ORDER — BUPIVACAINE-EPINEPHRINE (PF) 0.5% -1:200000 IJ SOLN
INTRAMUSCULAR | Status: DC | PRN
  Administered 2023-08-16: 30 mL

## 2023-08-16 MED ORDER — CEFAZOLIN SODIUM-DEXTROSE 2-4 GM/100ML-% IV SOLN
2.0000 g | INTRAVENOUS | Status: AC
  Administered 2023-08-16: 2 g via INTRAVENOUS

## 2023-08-16 MED ORDER — SODIUM CHLORIDE 0.9 % IV SOLN: INTRAVENOUS | Status: DC | PRN

## 2023-08-16 MED ORDER — ONDANSETRON HCL 4 MG/2ML IJ SOLN
INTRAMUSCULAR | Status: AC
  Filled 2023-08-16: qty 2

## 2023-08-16 MED ORDER — FENTANYL CITRATE (PF) 100 MCG/2ML IJ SOLN
INTRAMUSCULAR | Status: AC
  Filled 2023-08-16: qty 2

## 2023-08-16 MED ORDER — PROPOFOL 10 MG/ML IV BOLUS
INTRAVENOUS | Status: DC | PRN
  Administered 2023-08-16: 140 mg via INTRAVENOUS
  Administered 2023-08-16: 20 mg via INTRAVENOUS

## 2023-08-16 MED ORDER — TRAMADOL HCL 50 MG PO TABS: 50.0000 mg | ORAL_TABLET | Freq: Four times a day (QID) | ORAL | 0 refills | Status: DC | PRN

## 2023-08-16 MED ORDER — DROPERIDOL 2.5 MG/ML IJ SOLN: 0.6250 mg | Freq: Once | INTRAMUSCULAR | Status: DC | PRN

## 2023-08-16 MED ORDER — PHENYLEPHRINE HCL (PRESSORS) 10 MG/ML IV SOLN
INTRAVENOUS | Status: DC | PRN
  Administered 2023-08-16: 80 ug via INTRAVENOUS

## 2023-08-16 MED ORDER — SCOPOLAMINE 1 MG/3DAYS TD PT72: 1.0000 | MEDICATED_PATCH | TRANSDERMAL | Status: DC

## 2023-08-16 MED ORDER — ONDANSETRON HCL 4 MG/2ML IJ SOLN
INTRAMUSCULAR | Status: DC | PRN
  Administered 2023-08-16: 4 mg via INTRAVENOUS

## 2023-08-16 MED ORDER — ACETAMINOPHEN 500 MG PO TABS
1000.0000 mg | ORAL_TABLET | ORAL | Status: AC
  Administered 2023-08-16: 1000 mg via ORAL

## 2023-08-16 MED ORDER — BUPIVACAINE HCL (PF) 0.25 % IJ SOLN
INTRAMUSCULAR | Status: DC | PRN
  Administered 2023-08-16: 2 mL

## 2023-08-16 MED ORDER — DEXAMETHASONE SODIUM PHOSPHATE 4 MG/ML IJ SOLN
INTRAMUSCULAR | Status: DC | PRN
  Administered 2023-08-16: 10 mg via INTRAVENOUS

## 2023-08-16 MED ORDER — CEFAZOLIN SODIUM-DEXTROSE 2-4 GM/100ML-% IV SOLN
INTRAVENOUS | Status: AC
  Filled 2023-08-16: qty 100

## 2023-08-16 MED ORDER — KETOROLAC TROMETHAMINE 15 MG/ML IJ SOLN
15.0000 mg | INTRAMUSCULAR | Status: AC
  Administered 2023-08-16: 15 mg via INTRAVENOUS

## 2023-08-16 MED ORDER — OXYCODONE HCL 5 MG PO TABS: 5.0000 mg | ORAL_TABLET | Freq: Once | ORAL | Status: DC | PRN

## 2023-08-16 MED ORDER — FENTANYL CITRATE (PF) 100 MCG/2ML IJ SOLN
INTRAMUSCULAR | Status: DC | PRN
  Administered 2023-08-16: 50 ug via INTRAVENOUS

## 2023-08-16 MED ORDER — FENTANYL CITRATE (PF) 100 MCG/2ML IJ SOLN
25.0000 ug | INTRAMUSCULAR | Status: DC | PRN
  Administered 2023-08-16: 25 ug via INTRAVENOUS

## 2023-08-16 MED ORDER — MAGTRACE LYMPHATIC TRACER
INTRAMUSCULAR | Status: DC | PRN
  Administered 2023-08-16: 1.5 mL via INTRAMUSCULAR

## 2023-08-16 MED ORDER — ACETAMINOPHEN 325 MG PO TABS: 325.0000 mg | ORAL_TABLET | ORAL | Status: DC | PRN

## 2023-08-16 MED ORDER — PROPOFOL 500 MG/50ML IV EMUL
INTRAVENOUS | Status: AC
Start: 2023-08-16 — End: ?
  Filled 2023-08-16: qty 50

## 2023-08-16 MED ORDER — MIDAZOLAM HCL 2 MG/2ML IJ SOLN
INTRAMUSCULAR | Status: AC
  Filled 2023-08-16: qty 2

## 2023-08-16 MED ORDER — KETOROLAC TROMETHAMINE 15 MG/ML IJ SOLN
INTRAMUSCULAR | Status: AC
  Filled 2023-08-16: qty 1

## 2023-08-16 MED ORDER — OXYCODONE HCL 5 MG/5ML PO SOLN: 5.0000 mg | Freq: Once | ORAL | Status: DC | PRN

## 2023-08-16 MED ORDER — DEXAMETHASONE SODIUM PHOSPHATE 10 MG/ML IJ SOLN
INTRAMUSCULAR | Status: AC
  Filled 2023-08-16: qty 1

## 2023-08-16 MED ORDER — BUPIVACAINE HCL (PF) 0.25 % IJ SOLN
INTRAMUSCULAR | Status: AC
  Filled 2023-08-16: qty 120

## 2023-08-16 MED ORDER — PROPOFOL 500 MG/50ML IV EMUL
INTRAVENOUS | Status: DC | PRN
  Administered 2023-08-16: 150 ug/kg/min via INTRAVENOUS

## 2023-08-16 MED ORDER — ACETAMINOPHEN 160 MG/5ML PO SOLN: 325.0000 mg | ORAL | Status: DC | PRN

## 2023-08-16 MED ORDER — ACETAMINOPHEN 10 MG/ML IV SOLN: 1000.0000 mg | Freq: Once | INTRAVENOUS | Status: DC | PRN

## 2023-08-16 MED ORDER — LIDOCAINE 2% (20 MG/ML) 5 ML SYRINGE
INTRAMUSCULAR | Status: AC
  Filled 2023-08-16: qty 5

## 2023-08-16 MED ORDER — LACTATED RINGERS IV SOLN: INTRAVENOUS | Status: DC

## 2023-08-16 MED ORDER — ACETAMINOPHEN 500 MG PO TABS
ORAL_TABLET | ORAL | Status: AC
  Filled 2023-08-16: qty 2

## 2023-08-16 MED ORDER — MIDAZOLAM HCL 2 MG/2ML IJ SOLN
2.0000 mg | Freq: Once | INTRAMUSCULAR | Status: AC
  Administered 2023-08-16: 2 mg via INTRAVENOUS

## 2023-08-16 MED ORDER — FENTANYL CITRATE (PF) 100 MCG/2ML IJ SOLN
100.0000 ug | Freq: Once | INTRAMUSCULAR | Status: AC
  Administered 2023-08-16: 50 ug via INTRAVENOUS

## 2023-08-16 SURGICAL SUPPLY — 51 items
ADH SKN CLS APL DERMABOND .7 (GAUZE/BANDAGES/DRESSINGS) ×1
APL PRP STRL LF DISP 70% ISPRP (MISCELLANEOUS) ×1
APPLIER CLIP 9.375 MED OPEN (MISCELLANEOUS) ×1
APR CLP MED 9.3 20 MLT OPN (MISCELLANEOUS) ×1
BLADE SURG 15 STRL LF DISP TIS (BLADE) ×1 IMPLANT
BLADE SURG 15 STRL SS (BLADE) ×1
CANISTER SUC SOCK COL 7IN (MISCELLANEOUS) IMPLANT
CANISTER SUCT 1200ML W/VALVE (MISCELLANEOUS) IMPLANT
CHLORAPREP W/TINT 26 (MISCELLANEOUS) ×1 IMPLANT
CLIP APPLIE 9.375 MED OPEN (MISCELLANEOUS) IMPLANT
COVER BACK TABLE 60X90IN (DRAPES) ×1 IMPLANT
COVER MAYO STAND STRL (DRAPES) ×1 IMPLANT
COVER PROBE CYLINDRICAL 5X96 (MISCELLANEOUS) ×1 IMPLANT
DERMABOND ADVANCED .7 DNX12 (GAUZE/BANDAGES/DRESSINGS) ×1 IMPLANT
DRAPE LAPAROSCOPIC ABDOMINAL (DRAPES) ×1 IMPLANT
DRAPE UTILITY XL STRL (DRAPES) ×1 IMPLANT
ELECT COATED BLADE 2.86 ST (ELECTRODE) ×1 IMPLANT
ELECT REM PT RETURN 9FT ADLT (ELECTROSURGICAL) ×1
ELECTRODE REM PT RTRN 9FT ADLT (ELECTROSURGICAL) ×1 IMPLANT
GLOVE BIO SURGEON STRL SZ7 (GLOVE) ×2 IMPLANT
GLOVE BIOGEL PI IND STRL 6.5 (GLOVE) IMPLANT
GLOVE BIOGEL PI IND STRL 7.5 (GLOVE) ×1 IMPLANT
GLOVE SURG SS PI 6.5 STRL IVOR (GLOVE) IMPLANT
GOWN SPEC L3 XXLG W/TWL (GOWN DISPOSABLE) IMPLANT
GOWN STRL REUS W/ TWL LRG LVL3 (GOWN DISPOSABLE) ×2 IMPLANT
GOWN STRL REUS W/TWL LRG LVL3 (GOWN DISPOSABLE) ×3
HEMOSTAT ARISTA ABSORB 3G PWDR (HEMOSTASIS) IMPLANT
KIT MARKER MARGIN INK (KITS) ×1 IMPLANT
NDL HYPO 25X1 1.5 SAFETY (NEEDLE) ×1 IMPLANT
NEEDLE HYPO 25X1 1.5 SAFETY (NEEDLE) ×2
NS IRRIG 1000ML POUR BTL (IV SOLUTION) IMPLANT
PACK BASIN DAY SURGERY FS (CUSTOM PROCEDURE TRAY) ×1 IMPLANT
PENCIL SMOKE EVACUATOR (MISCELLANEOUS) ×1 IMPLANT
RETRACTOR ONETRAX LX 90X20 (MISCELLANEOUS) IMPLANT
SLEEVE SCD COMPRESS KNEE MED (STOCKING) ×1 IMPLANT
SPONGE T-LAP 4X18 ~~LOC~~+RFID (SPONGE) ×1 IMPLANT
STRIP CLOSURE SKIN 1/2X4 (GAUZE/BANDAGES/DRESSINGS) ×1 IMPLANT
SUT ETHILON 2 0 FS 18 (SUTURE) IMPLANT
SUT MNCRL AB 4-0 PS2 18 (SUTURE) ×1 IMPLANT
SUT MON AB 5-0 PS2 18 (SUTURE) IMPLANT
SUT SILK 2 0 SH (SUTURE) IMPLANT
SUT VIC AB 2-0 SH 27 (SUTURE) ×3
SUT VIC AB 2-0 SH 27XBRD (SUTURE) ×1 IMPLANT
SUT VIC AB 3-0 SH 27 (SUTURE) ×2
SUT VIC AB 3-0 SH 27X BRD (SUTURE) ×1 IMPLANT
SYR CONTROL 10ML LL (SYRINGE) ×1 IMPLANT
TOWEL GREEN STERILE FF (TOWEL DISPOSABLE) ×1 IMPLANT
TRACER MAGTRACE VIAL (MISCELLANEOUS) IMPLANT
TRAY FAXITRON CT DISP (TRAY / TRAY PROCEDURE) ×1 IMPLANT
TUBE CONNECTING 20X1/4 (TUBING) IMPLANT
YANKAUER SUCT BULB TIP NO VENT (SUCTIONS) IMPLANT

## 2023-08-16 NOTE — Anesthesia Procedure Notes (Signed)
Procedure Name: LMA Insertion Date/Time: 08/16/2023 7:35 AM  Performed by: Burna Cash, CRNAPre-anesthesia Checklist: Patient identified, Emergency Drugs available, Suction available and Patient being monitored Patient Re-evaluated:Patient Re-evaluated prior to induction Oxygen Delivery Method: Circle system utilized Preoxygenation: Pre-oxygenation with 100% oxygen Induction Type: IV induction Ventilation: Mask ventilation without difficulty LMA: LMA inserted LMA Size: 4.0 Number of attempts: 1 Airway Equipment and Method: Bite block Placement Confirmation: positive ETCO2 Tube secured with: Tape Dental Injury: Teeth and Oropharynx as per pre-operative assessment

## 2023-08-16 NOTE — Anesthesia Preprocedure Evaluation (Addendum)
Anesthesia Evaluation  Patient identified by MRN, date of birth, ID band Patient awake    Reviewed: Allergy & Precautions, NPO status , Patient's Chart, lab work & pertinent test results  Airway Mallampati: II  TM Distance: >3 FB Neck ROM: Full    Dental  (+) Teeth Intact, Dental Advisory Given   Pulmonary neg pulmonary ROS   breath sounds clear to auscultation       Cardiovascular negative cardio ROS  Rhythm:Regular Rate:Normal     Neuro/Psych negative neurological ROS  negative psych ROS   GI/Hepatic negative GI ROS, Neg liver ROS,,,  Endo/Other  negative endocrine ROS    Renal/GU negative Renal ROS     Musculoskeletal negative musculoskeletal ROS (+)    Abdominal   Peds  Hematology negative hematology ROS (+)   Anesthesia Other Findings   Reproductive/Obstetrics                             Anesthesia Physical Anesthesia Plan  ASA: 2  Anesthesia Plan: General   Post-op Pain Management: Tylenol PO (pre-op)* and Toradol IV (intra-op)*   Induction: Intravenous  PONV Risk Score and Plan: 4 or greater and Ondansetron, Dexamethasone, Midazolam and Scopolamine patch - Pre-op  Airway Management Planned: LMA  Additional Equipment: None  Intra-op Plan:   Post-operative Plan: Extubation in OR  Informed Consent: I have reviewed the patients History and Physical, chart, labs and discussed the procedure including the risks, benefits and alternatives for the proposed anesthesia with the patient or authorized representative who has indicated his/her understanding and acceptance.     Dental advisory given  Plan Discussed with: CRNA  Anesthesia Plan Comments:        Anesthesia Quick Evaluation

## 2023-08-16 NOTE — Interval H&P Note (Signed)
History and Physical Interval Note:  08/16/2023 7:01 AM  Victoria Garza  has presented today for surgery, with the diagnosis of RIGHT BREAST CANCER.  The various methods of treatment have been discussed with the patient and family. After consideration of risks, benefits and other options for treatment, the patient has consented to  Procedure(s) with comments: RIGHT BREAST SEED BRACKETED LUMPECTOMY, RIGHT AXILLARY SENTINEL NODE BIOPSY (Right) - PEC BLOCK as a surgical intervention.  The patient's history has been reviewed, patient examined, no change in status, stable for surgery.  I have reviewed the patient's chart and labs.  Questions were answered to the patient's satisfaction.     Emelia Loron

## 2023-08-16 NOTE — Transfer of Care (Signed)
Immediate Anesthesia Transfer of Care Note  Patient: Victoria Garza  Procedure(s) Performed: RIGHT BREAST SEED BRACKETED LUMPECTOMY, RIGHT AXILLARY SENTINEL NODE BIOPSY (Right: Breast)  Patient Location: PACU  Anesthesia Type:General  Level of Consciousness: sedated  Airway & Oxygen Therapy: Patient Spontanous Breathing and Patient connected to face mask oxygen  Post-op Assessment: Report given to RN and Post -op Vital signs reviewed and stable  Post vital signs: Reviewed and stable  Last Vitals:  Vitals Value Taken Time  BP 108/72 08/16/23 0908  Temp    Pulse 77 08/16/23 0909  Resp 15 08/16/23 0909  SpO2 100 % 08/16/23 0909  Vitals shown include unfiled device data.  Last Pain:  Vitals:   08/16/23 0726  TempSrc:   PainSc: 0-No pain      Patients Stated Pain Goal: 5 (08/16/23 0960)  Complications: No notable events documented.

## 2023-08-16 NOTE — Anesthesia Procedure Notes (Signed)
Anesthesia Regional Block: Pectoralis block   Pre-Anesthetic Checklist: , timeout performed,  Correct Patient, Correct Site, Correct Laterality,  Correct Procedure, Correct Position, site marked,  Risks and benefits discussed,  Surgical consent,  Pre-op evaluation,  At surgeon's request and post-op pain management  Laterality: Right  Prep: chloraprep       Needles:  Injection technique: Single-shot  Needle Type: Echogenic Stimulator Needle     Needle Length: 9cm  Needle Gauge: 21     Additional Needles:   Procedures:,,,, ultrasound used (permanent image in chart),,    Narrative:  Start time: 08/16/2023 7:10 AM End time: 08/16/2023 7:15 AM Injection made incrementally with aspirations every 5 mL.  Performed by: Personally  Anesthesiologist: Shelton Silvas, MD  Additional Notes: Discussed risks and benefits of the nerve block in detail, including but not limited vascular injury, permanent nerve damage and infection.   Patient tolerated the procedure well. Local anesthetic introduced in an incremental fashion under minimal resistance after negative aspirations. No paresthesias were elicited. After completion of the procedure, no acute issues were identified and patient continued to be monitored by RN.

## 2023-08-16 NOTE — Op Note (Signed)
Preoperative diagnosis: Clinical stage I right breast cancer Postoperative diagnosis: Same as above Procedure: 1.  Right breast radioactive seed bracketed lumpectomy 2.  Injection of mag trace for sentinel lymph node identification 3.  Right deep axillary sentinel lymph node biopsy Surgeon: Dr. Harden Mo Anesthesia: General With a pectoral block Complications: None Drains: None Estimated blood loss: Less than 50 cc Specimens: 1.  Right breast tissue containing 2 seeds and 2 clips 2.  Additional superior and lateral margins marked short superior, long lateral, double deep 3.  Right deep axillary sentinel lymph nodes with highest count of 1512 Sponge needle count was correct completion Disposition recovery stable condition  Indications:52 yof with fh in her sister at age 70 who had screening mm that showed architectural disortion in the ruoq. This is 1.5 cm on mm and by [place] is 1 cm. She had axillary [place] that was negative. CEM was done that shows negative left breast. The right breast has a 1.1 cm mass and then 2.5 cm NME posterior to it. The clips from biopsies are 3.5 cm apart and the mass and posterior extent were biopsied. Biopsy of mass is grade II IMC that is 95% er pos, 100% pr pos, her 2 negative and Ki is 10%. Biopsy of posterior part is int gr dcis.  We discussed proceeding with a seed bracketed lumpectomy and sentinel lymph node biopsy.  Procedure: After informed consent was obtained she was taken to the operating room.  She underwent a pectoral block with anesthesia.  She was given antibiotics.  SCDs were in place.  She was placed under general anesthesia without complication.  She was prepped and draped in the standard sterile surgical fashion.  A surgical timeout was then performed.  I first injected 1.5 cc of mag trace in the subareolar position and massaged this for 10 minutes.  It appeared that the tracer did travel to the axilla.  I then identified the seeds in the right  upper outer quadrant.  I made a curvilinear incision between them and then used the neoprobe to remove both seeds and the intervening tissue.  1 of these was close to the skin and the anterior margin is the skin for the 1 in the higher upper outer quadrant.  I confirmed removal of the seeds and the clips with mammography.  I then thought I was close to a couple margins on CT imaging so I did remove these as above.  Hemostasis was obtained.  I placed clips in the cavity.  I then closed the cavity with 2-0 Vicryl.  The skin was closed with 3-0 Vicryl and 5-0 Monocryl.  Eventually glue and Steri-Strips were both applied.  I then made an incision in the low axilla below the hairline.  I dissected through the axillary fascia.  Using the Sentimag probe I was able to identify what appeared to be several lymph nodes.  1 of these appeared to be brown.  I removed these.  There was no background radioactivity.  I did have to place some clips on some small vessels.  Hemostasis was then obtained.  I did place some Arista in this cavity.  I then closed the axillary fascia with 2-0 Vicryl.  The skin was closed with 3-0 Vicryl and 4-0 Monocryl.  Glue and Steri-Strips were applied.  She tolerated this well was extubated and transferred to recovery stable.

## 2023-08-16 NOTE — Discharge Instructions (Addendum)
[facility] Office Phone Number [phone]  POST OP INSTRUCTIONS Take 400 mg of ibuprofen every 8 hours or 650 mg tylenol every 6 hours for next 72 hours then as needed. Use ice several times daily also.  A prescription for pain medication may be given to you upon discharge.  Take your pain medication as prescribed, if needed.  If narcotic pain medicine is not needed, then you may take acetaminophen (Tylenol), naprosyn (Alleve) or ibuprofen (Advil) as needed. Take your usually prescribed medications unless otherwise directed If you need a refill on your pain medication, please contact your pharmacy.  They will contact our office to request authorization.  Prescriptions will not be filled after 5pm or on week-ends. You should eat very light the first 24 hours after surgery, such as soup, crackers, pudding, etc.  Resume your normal diet the day after surgery. Most patients will experience some swelling and bruising in the breast.  Ice packs and a good support bra will help.  Wear the breast binder provided or a sports bra for 72 hours day and night.  After that wear a sports bra during the day until you return to the office. Swelling and bruising can take several days to resolve.  It is common to experience some constipation if taking pain medication after surgery.  Increasing fluid intake and taking a stool softener will usually help or prevent this problem from occurring.  A mild laxative (Milk of Magnesia or Miralax) should be taken according to package directions if there are no bowel movements after 48 hours. I used skin glue on the incision, you may shower in 24 hours.  The glue will flake off over the next 2-3 weeks.  Any sutures or staples will be removed at the office during your follow-up visit. ACTIVITIES:  You may resume regular daily activities (gradually increasing) beginning the next day.  Wearing a good support bra or sports bra minimizes pain and swelling.  You may have  sexual intercourse when it is comfortable. You may drive when you no longer are taking prescription pain medication, you can comfortably wear a seatbelt, and you can safely maneuver your car and apply brakes. RETURN TO WORK:  ______________________________________________________________________________________ Victoria Garza should see your doctor in the office for a follow-up appointment approximately two weeks after your surgery.  Your doctor's nurse will typically make your follow-up appointment when she calls you with your pathology report.  Expect your pathology report 3-4 business days after your surgery.  You may call to check if you do not hear from [place] after three days. OTHER INSTRUCTIONS: _______________________________________________________________________________________________ _____________________________________________________________________________________________________________________________________ _____________________________________________________________________________________________________________________________________ _____________________________________________________________________________________________________________________________________  WHEN TO CALL DR WAKEFIELD: Fever over 101.0 Nausea and/or vomiting. Extreme swelling or bruising. Continued bleeding from incision. Increased pain, redness, or drainage from the incision.  The clinic staff is available to answer your questions during regular business hours.  Please don't hesitate to call and ask to speak to one of the nurses for clinical concerns.  If you have a medical emergency, go to the nearest emergency room or call 911.  A surgeon from [facility] is always on call at the hospital.  For further questions, please visit [email or web address]     Post Anesthesia Home Care Instructions  Activity: Get plenty of rest for the remainder of the day. A responsible individual must stay  with you for 24 hours following the procedure.  For the next 24 hours, DO NOT: -Drive a car -Advertising copywriter -Drink alcoholic beverages -Take any medication unless  instructed by your physician -Make any legal decisions or sign important papers.  Meals: Start with liquid foods such as gelatin or soup. Progress to regular foods as tolerated. Avoid greasy, spicy, heavy foods. If nausea and/or vomiting occur, drink only clear liquids until the nausea and/or vomiting subsides. Call your physician if vomiting continues.  Special Instructions/Symptoms: Your throat may feel dry or sore from the anesthesia or the breathing tube placed in your throat during surgery. If this causes discomfort, gargle with warm salt water. The discomfort should disappear within 24 hours.  If you had a scopolamine patch placed behind your ear for the management of post- operative nausea and/or vomiting:  1. The medication in the patch is effective for 72 hours, after which it should be removed.  Wrap patch in a tissue and discard in the trash. Wash hands thoroughly with soap and water. 2. You may remove the patch earlier than 72 hours if you experience unpleasant side effects which may include dry mouth, dizziness or visual disturbances. 3. Avoid touching the patch. Wash your hands with soap and water after contact with the patch.     Regional Anesthesia Blocks  1. You may not be able to move or feel the "blocked" extremity after a regional anesthetic block. This may last may last from 3-48 hours after placement, but it will go away. The length of time depends on the medication injected and your individual response to the medication. As the nerves start to wake up, you may experience tingling as the movement and feeling returns to your extremity. If the numbness and inability to move your extremity has not gone away after 48 hours, please call your surgeon.   2. The extremity that is blocked will need to be protected  until the numbness is gone and the strength has returned. Because you cannot feel it, you will need to take extra care to avoid injury. Because it may be weak, you may have difficulty moving it or using it. You may not know what position it is in without looking at it while the block is in effect.  3. For blocks in the legs and feet, returning to weight bearing and walking needs to be done carefully. You will need to wait until the numbness is entirely gone and the strength has returned. You should be able to move your leg and foot normally before you try and bear weight or walk. You will need someone to be with you when you first try to ensure you do not fall and possibly risk injury.  4. Bruising and tenderness at the needle site are common side effects and will resolve in a few days.  5. Persistent numbness or new problems with movement should be communicated to the surgeon or the [facility] [phone] [facility] [phone]).   Next dose of Tylenol may be given at 12:40pm if needed. Next dose of NSAIDs (Ibuprofen/Motrin/Aleve) may be given at 2:50pm if needed.

## 2023-08-17 ENCOUNTER — Encounter (HOSPITAL_BASED_OUTPATIENT_CLINIC_OR_DEPARTMENT_OTHER): Payer: Self-pay | Admitting: General Surgery

## 2023-08-17 NOTE — Anesthesia Postprocedure Evaluation (Signed)
Anesthesia Post Note  Patient: Boluwatife Hokett  Procedure(s) Performed: RIGHT BREAST SEED BRACKETED LUMPECTOMY, RIGHT AXILLARY SENTINEL NODE BIOPSY (Right: Breast)     Patient location during evaluation: PACU Anesthesia Type: General Level of consciousness: awake and alert Pain management: pain level controlled Vital Signs Assessment: post-procedure vital signs reviewed and stable Respiratory status: spontaneous breathing, nonlabored ventilation, respiratory function stable and patient connected to nasal cannula oxygen Cardiovascular status: blood pressure returned to baseline and stable Postop Assessment: no apparent nausea or vomiting Anesthetic complications: no   No notable events documented.  Last Vitals:  Vitals:   08/16/23 1010 08/16/23 1040  BP: 108/79 118/73  Pulse: 61 67  Resp: 12 18  Temp:  36.6 C  SpO2: 97% 99%    Last Pain:  Vitals:   08/16/23 1028  TempSrc:   PainSc: 3    Pain Goal: Patients Stated Pain Goal: 5 (08/16/23 5366)                 Shelton Silvas

## 2023-08-19 LAB — SURGICAL PATHOLOGY

## 2023-08-22 ENCOUNTER — Encounter: Payer: Self-pay | Admitting: *Deleted

## 2023-08-22 ENCOUNTER — Telehealth: Payer: Self-pay | Admitting: *Deleted

## 2023-08-22 NOTE — Telephone Encounter (Signed)
Received order for oncotype testing. Requisition sent to pathology 

## 2023-08-29 ENCOUNTER — Other Ambulatory Visit: Payer: Self-pay

## 2023-08-29 ENCOUNTER — Inpatient Hospital Stay: Payer: 59 | Attending: Hematology and Oncology | Admitting: Hematology and Oncology

## 2023-08-29 VITALS — BP 142/76 | HR 87 | Temp 97.5°F | Resp 18 | Ht 62.0 in | Wt 161.2 lb

## 2023-08-29 DIAGNOSIS — Z170 Estrogen receptor positive status [ER+]: Secondary | ICD-10-CM | POA: Diagnosis present

## 2023-08-29 DIAGNOSIS — R635 Abnormal weight gain: Secondary | ICD-10-CM | POA: Diagnosis not present

## 2023-08-29 DIAGNOSIS — C50411 Malignant neoplasm of upper-outer quadrant of right female breast: Secondary | ICD-10-CM | POA: Diagnosis present

## 2023-08-29 DIAGNOSIS — G8918 Other acute postprocedural pain: Secondary | ICD-10-CM | POA: Insufficient documentation

## 2023-08-29 DIAGNOSIS — Z803 Family history of malignant neoplasm of breast: Secondary | ICD-10-CM | POA: Diagnosis not present

## 2023-08-29 NOTE — Assessment & Plan Note (Signed)
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 I discussed the margins as well as lymph node surgeries. We also discussed the final staging along with previously performed ER/PR and HER-2/neu testing.  Treatment plan: Oncotype DX testing to determine if chemotherapy would be of any benefit followed by 2. Adjuvant radiation therapy followed by 3. Adjuvant antiestrogen therapy  Return to clinic based upon Oncotype DX test result

## 2023-08-29 NOTE — Progress Notes (Signed)
Patient Care Team: Noralee Chars as PCP - Delford Field, RN as Oncology Nurse Navigator Donnelly Angelica, RN as Oncology Nurse Navigator Serena Croissant, MD as Consulting Physician (Hematology and Oncology) Dorothy Puffer, MD as Consulting Physician (Radiation Oncology) Emelia Loron, MD as Consulting Physician (General Surgery)  DIAGNOSIS:  Encounter Diagnosis  Name Primary?   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, ER 95%, PR 100%, Ki67 10%, HER2 negative   08/16/2023 Surgery   Right lumpectomy: 2 foci of grade 2 ILC 3.5 cm and 1.2 cm, LCIS, DCIS intermediate grade, no lymphovascular invasion, 0/5 lymph nodes, margins negative, ER 95%, PR 100%, HER2 1+ negative, Ki-67 10%   08/29/2023 Cancer Staging   Staging form: Breast, AJCC 8th Edition - Pathologic: Stage IA (pT2, pN0, cM0, G2, ER+, PR+, HER2-) - Signed by Serena Croissant, MD on 08/29/2023 Histologic grading system: 3 grade system     CHIEF COMPLIANT: Follow-up after breast surgery  HISTORY OF PRESENT ILLNESS:   History of Present Illness   The patient, with a history of breast cancer, presents for a post-operative follow-up. She underwent surgery for lobular breast cancer, with two areas of concern, one measuring 3.5 cm. The patient was aware of the pathology results, having monitored her chart. She expresses surprise at the size of the cancer, as initial imaging suggested a  smaller area of DCIS. The patient understands that lobular cancer is difficult to image, leading to the discrepancy in size. She also acknowledges that size is not the primary marker of risk.  The patient reports experiencing a burning sensation post-surgery, a common complaint that is expected to fade over time. She also notes numbness in the underarm area, making it difficult to tell if she has applied deodorant correctly.  The patient expresses concern about significant weight gain over the past year and a half, amounting to nearly 30 pounds. Despite efforts to address this, including signing up with a personal trainer and seeking help from a weight loss center, the patient has not seen improvement. She is considering medication as a last resort if all other efforts fail.  The patient also mentions a family history of breast cancer, with a sister who passed away from an aggressive form of the disease in 2007. Despite this, the patient remains optimistic about her own prognosis, noting advancements in breast cancer treatment since her sister's diagnosis.         ALLERGIES:  has No Known Allergies.  MEDICATIONS:  Current Outpatient Medications  Medication Sig Dispense Refill   FLUoxetine (PROZAC) 20 MG capsule Take 20 mg by mouth daily.     levothyroxine (SYNTHROID) 125 MCG tablet Take 125 mcg by mouth daily before breakfast.     loratadine (CLARITIN) 10 MG tablet Take 10 mg by mouth daily. [employer] brand AllerClear     traMADol (ULTRAM) 50 MG tablet Take 1 tablet (50 mg total) by mouth every 6 (six) hours as needed. 10 tablet 0   No current facility-administered medications for this visit.  PHYSICAL EXAMINATION: ECOG PERFORMANCE STATUS: 1 - Symptomatic but completely ambulatory  Vitals:   08/29/23 1251  BP: (!) 142/76  Pulse: 87  Resp: 18  Temp: (!) 97.5 F (36.4 C)  SpO2: 99%   Filed Weights   08/29/23 1251  Weight: 161 lb 3.2 oz (73.1 kg)    Physical Exam    MEASUREMENTS: WT- 160      (exam performed in the presence of a chaperone)  LABORATORY DATA:  I have reviewed the data as listed    Latest Ref Rng & Units 07/27/2023   12:58 PM 06/23/2018   10:57 AM  CMP  Glucose 70 - 99 mg/dL 161  93   BUN 6 - 20 mg/dL 14  16   Creatinine 0.96 - 1.00 mg/dL 0.45  4.09   Sodium 811 - 145 mmol/L 140  138   Potassium 3.5 - 5.1 mmol/L 3.6  3.8   Chloride 98 - 111 mmol/L 104  105   CO2 22 - 32 mmol/L 30  23   Calcium 8.9 - 10.3 mg/dL 9.4  8.9   Total Protein 6.5 - 8.1 g/dL 7.8  7.6   Total Bilirubin 0.3 - 1.2 mg/dL 0.5  0.9   Alkaline Phos 38 - 126 U/L 62  38   AST 15 - 41 U/L 18  20   ALT 0 - 44 U/L 15  14     Lab Results  Component Value Date   WBC 7.6 07/27/2023   HGB 14.5 07/27/2023   HCT 42.9 07/27/2023   MCV 89.2 07/27/2023   PLT 339 07/27/2023   NEUTROABS 5.0 07/27/2023    ASSESSMENT & PLAN:  Malignant neoplasm of upper-outer quadrant of right female breast (HCC) 07/20/2023:Screening mammogram detected right breast UOQ architectural distortion 1.5 cm, contrast-enhanced mammogram revealed 1.1 cm mass with 2.5 cm non-mass enhancement (positive for intermediate grade DCIS with necrosis) the primary mass biopsy came back as 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 I discussed the margins as well as lymph node surgeries. We also discussed the final staging along with previously performed ER/PR and HER-2/neu testing.  Treatment plan: Oncotype DX testing to determine if chemotherapy would be of any benefit followed by 2. Adjuvant radiation therapy followed by 3.  Adjuvant antiestrogen therapy  Return to clinic based upon Oncotype DX test result ------------------------------------- Assessment and Plan    Breast Cancer (Lobular) Post-surgical status with clear margins. Two areas of cancer identified, one 3.5 cm. Grade 2. Lymph nodes clear. Oncotype DX test pending to guide further treatment. - Await Oncotype DX results. - Plan for radiation therapy 4-6 weeks post-surgery, potentially starting around September 26, 2023, depending on Oncotype DX results.  Weight Gain Significant weight gain over the past year and a half, causing distress. No clear cause identified. - Encouraged to continue efforts to manage weight naturally. - Consider consultation with weight loss center if current efforts are unsuccessful.  Post-Surgical Pain Reports of burning sensation post-surgery. - Reassured that this is a common complaint and will fade over time.  Post-Surgical Care Wearing compression garment post-surgery. - Advised to continue  wearing for two weeks post-surgery, with further guidance to be provided by surgeon.  Future Surveillance Due to the nature of lobular breast cancer, discussed alternating mammograms with MRIs for future monitoring. - Plan to alternate mammograms with MRIs every six months.  Hormone Therapy Discussed potential future hormone therapies, including aromatase inhibitors and tamoxifen, depending on menopausal status. - Decision to be made after radiation therapy.  Clinical Trial Discussed potential eligibility for clinical trial involving ribociclib for patients with larger tumors. - Decision to be made after radiation therapy.        No orders of the defined types were placed in this encounter.  The patient has a good understanding of the overall plan. she agrees with it. she will call with any problems that may develop before the next visit here. Total time spent: 30 mins including face to face time and time spent for  planning, charting and co-ordination of care   Tamsen Meek, MD 08/29/23

## 2023-09-01 ENCOUNTER — Encounter (HOSPITAL_COMMUNITY): Payer: Self-pay

## 2023-09-02 ENCOUNTER — Encounter: Payer: Self-pay | Admitting: *Deleted

## 2023-09-02 ENCOUNTER — Telehealth: Payer: Self-pay | Admitting: *Deleted

## 2023-09-02 ENCOUNTER — Inpatient Hospital Stay (HOSPITAL_BASED_OUTPATIENT_CLINIC_OR_DEPARTMENT_OTHER): Payer: 59 | Admitting: Hematology and Oncology

## 2023-09-02 ENCOUNTER — Telehealth: Payer: Self-pay

## 2023-09-02 VITALS — BP 137/89 | HR 95 | Temp 98.1°F | Resp 18 | Ht 62.0 in | Wt 161.5 lb

## 2023-09-02 DIAGNOSIS — C50411 Malignant neoplasm of upper-outer quadrant of right female breast: Secondary | ICD-10-CM

## 2023-09-02 DIAGNOSIS — Z170 Estrogen receptor positive status [ER+]: Secondary | ICD-10-CM

## 2023-09-02 NOTE — Assessment & Plan Note (Addendum)
08/16/2023: Right lumpectomy: 2 foci of grade 2 ILC 3.5 cm and 1.2 cm, LCIS, DCIS intermediate grade, no lymphovascular invasion, 0/5 lymph nodes, margins negative, ER 95%, PR 100%, HER2 1+ negative, Ki-67 10%  Oncotype DX score 20 (6% risk of distant recurrence at 9 years)  Treatment plan: Adjuvant radiation therapy followed by Adjuvant antiestrogen therapy  I discussed with her extensively the result of Oncotype DX test and the fact that she does not get any significant benefit from chemotherapy and I would recommend proceeding onto radiation.  She would like to see radiation oncology at [facility]. We discussed extensively about surveillance for breast cancer after she completes treatment which would be with mammograms alternating with MRIs.  We would also consider doing guardant reveal for minimal residual disease.

## 2023-09-02 NOTE — Progress Notes (Signed)
Patient Care Team: Noralee Chars as PCP - Delford Field, RN as Oncology Nurse Navigator Donnelly Angelica, RN as Oncology Nurse Navigator Serena Croissant, MD as Consulting Physician (Hematology and Oncology) Dorothy Puffer, MD as Consulting Physician (Radiation Oncology) Emelia Loron, MD as Consulting Physician (General Surgery)  DIAGNOSIS:  Encounter Diagnosis  Name Primary?   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, ER 95%, PR 100%, Ki67 10%, HER2 negative   08/16/2023 Surgery   Right lumpectomy: 2 foci of grade 2 ILC 3.5 cm and 1.2 cm, LCIS, DCIS intermediate grade, no lymphovascular invasion, 0/5 lymph nodes, margins negative, ER 95%, PR 100%, HER2 1+ negative, Ki-67 10%   08/16/2023 Oncotype testing   Oncotype DX score: 20 (6% risk of distant recurrence at 9 years)   08/29/2023 Cancer Staging   Staging form: Breast, AJCC 8th Edition - Pathologic: Stage IA (pT2, pN0, cM0, G2, ER+, PR+, HER2-) - Signed by Serena Croissant, MD on 08/29/2023 Histologic grading system: 3 grade system     CHIEF COMPLIANT: Follow-up to discuss result of Oncotype DX  HISTORY OF PRESENT ILLNESS:   History of Present Illness   The patient, with a recent diagnosis of lobular breast cancer, presents with concerns about her treatment plan and prognosis. She has undergone surgery with clean margins and lymph nodes. She is premenopausal and has a family  history of breast cancer. The patient's Oncotype score is 20, which is on the cusp of the range where chemotherapy might be considered. She expresses anxiety about this score and questions whether she should have opted for a more aggressive treatment approach such as mastectomy. The patient is also curious about the potential for recurrence of her cancer and the factors that might influence this.         ALLERGIES:  has No Known Allergies.  MEDICATIONS:  Current Outpatient Medications  Medication Sig Dispense Refill   FLUoxetine (PROZAC) 20 MG capsule Take 20 mg by mouth daily.     levothyroxine (SYNTHROID) 125 MCG tablet Take 125 mcg by mouth daily before breakfast.     loratadine (CLARITIN) 10 MG tablet Take 10 mg by mouth daily. [employer] brand AllerClear     traMADol (ULTRAM) 50 MG tablet Take 1 tablet (50 mg total) by mouth every 6 (six) hours as needed. 10 tablet 0   No current facility-administered medications for this visit.    PHYSICAL EXAMINATION: ECOG PERFORMANCE STATUS: 1 - Symptomatic but completely ambulatory  Vitals:   09/02/23 1221  BP: 137/89  Pulse: 95  Resp: 18  Temp: 98.1 F (36.7 C)  SpO2: 98%   Filed Weights   09/02/23 1221  Weight: 161 lb 8 oz (73.3 kg)      LABORATORY DATA:  I have reviewed the data as listed    Latest Ref Rng & Units 07/27/2023   12:58 PM 06/23/2018   10:57 AM  CMP  Glucose 70 - 99 mg/dL 629  93   BUN 6 - 20 mg/dL 14  16  Creatinine 0.44 - 1.00 mg/dL 8.41  3.24   Sodium 401 - 145 mmol/L 140  138   Potassium 3.5 - 5.1 mmol/L 3.6  3.8   Chloride 98 - 111 mmol/L 104  105   CO2 22 - 32 mmol/L 30  23   Calcium 8.9 - 10.3 mg/dL 9.4  8.9   Total Protein 6.5 - 8.1 g/dL 7.8  7.6   Total Bilirubin 0.3 - 1.2 mg/dL 0.5  0.9   Alkaline Phos 38 - 126 U/L 62  38   AST 15 - 41 U/L 18  20   ALT 0 - 44 U/L 15  14     Lab Results  Component Value Date   WBC 7.6 07/27/2023   HGB 14.5 07/27/2023   HCT 42.9 07/27/2023   MCV 89.2  07/27/2023   PLT 339 07/27/2023   NEUTROABS 5.0 07/27/2023    ASSESSMENT & PLAN:  Malignant neoplasm of upper-outer quadrant of right female breast (HCC) 08/16/2023: Right lumpectomy: 2 foci of grade 2 ILC 3.5 cm and 1.2 cm, LCIS, DCIS intermediate grade, no lymphovascular invasion, 0/5 lymph nodes, margins negative, ER 95%, PR 100%, HER2 1+ negative, Ki-67 10%  Oncotype DX score 20 (6% risk of distant recurrence at 9 years)  Treatment plan: Adjuvant radiation therapy followed by Adjuvant antiestrogen therapy  I discussed with her extensively the result of Oncotype DX test and the fact that she does not get any significant benefit from chemotherapy and I would recommend proceeding onto radiation.  She would like to see radiation oncology at [facility]. We discussed extensively about surveillance for breast cancer after she completes treatment which would be with mammograms alternating with MRIs.  We would also consider doing guardant reveal for minimal residual disease. ------------------------------------- Assessment and Plan    Breast Cancer Lumpectomy performed with clean margins and clear lymph nodes. Oncotype score of 20, indicating low risk of distant recurrence. Patient is premenopausal. Discussed the benefits and risks of chemotherapy, radiation, and hormone therapy. Patient expressed concerns about quality of life and potential impact on her golf game. - No chemotherapy recommended due to low Oncotype score and minimal benefit for the risk of undergoing chemotherapy. - Proceed with radiation therapy 4-6 weeks post-surgery to reduce local recurrence risk. Radiation oncologist to determine exact timing and dosage. - Plan for hormone therapy for 7-10 years due to lobular cancer type and premenopausal status. - Consider Gardant Next Reveal blood test every six months for early detection of recurrence. - Plan for annual MRIs post-treatment for monitoring.  General Health  Maintenance - Lifestyle modifications and survivorship talk with lifestyle expert, Mardella Layman.      No orders of the defined types were placed in this encounter.  The patient has a good understanding of the overall plan. she agrees with it. she will call with any problems that may develop before the next visit here. Total time spent: 30 mins including face to face time and time spent for planning, charting and co-ordination of care   Tamsen Meek, MD 09/02/23

## 2023-09-02 NOTE — Telephone Encounter (Signed)
Called pt. Per Md that he was running behind and he should be on the telephone visit in 10 minutes. Pt verbalized understanding.

## 2023-09-02 NOTE — Telephone Encounter (Signed)
Received oncotype score of 20. Physician team aware.  Called pt, request to further discuss with Dr. Pamelia Hoit. Scheduled and confirmed appt for 11/15 at 12:15  Pt wish to receive xrt in [place]. Referral sent.

## 2023-09-05 ENCOUNTER — Ambulatory Visit: Payer: 59 | Attending: General Surgery | Admitting: Rehabilitation

## 2023-09-05 ENCOUNTER — Encounter: Payer: Self-pay | Admitting: Rehabilitation

## 2023-09-05 DIAGNOSIS — R293 Abnormal posture: Secondary | ICD-10-CM | POA: Insufficient documentation

## 2023-09-05 DIAGNOSIS — C50411 Malignant neoplasm of upper-outer quadrant of right female breast: Secondary | ICD-10-CM | POA: Diagnosis present

## 2023-09-05 DIAGNOSIS — Z170 Estrogen receptor positive status [ER+]: Secondary | ICD-10-CM | POA: Diagnosis present

## 2023-09-05 DIAGNOSIS — Z483 Aftercare following surgery for neoplasm: Secondary | ICD-10-CM | POA: Diagnosis present

## 2023-09-05 NOTE — Therapy (Deleted)
OUTPATIENT PHYSICAL THERAPY BREAST CANCER POST OP FOLLOW UP   Patient Name: Victoria Garza MRN: [id number] DOB:1971/01/12, 52 y.o., female Today's Date: 09/05/2023  END OF SESSION:   Past Medical History:  Diagnosis Date   Breast cancer [facility])    Past Surgical History:  Procedure Laterality Date   BREAST BIOPSY Left 06/05/2007   BREAST BIOPSY Right 06/28/2010   BREAST LUMPECTOMY WITH RADIOACTIVE SEED AND SENTINEL LYMPH NODE BIOPSY Right 08/16/2023   Procedure: RIGHT BREAST SEED BRACKETED LUMPECTOMY, RIGHT AXILLARY SENTINEL NODE BIOPSY;  Surgeon: Emelia Loron, MD;  Location: [facility];  Service: General;  Laterality: Right;  PEC BLOCK   THYROIDECTOMY     Patient Active Problem List   Diagnosis Date Noted   Malignant neoplasm of upper-outer quadrant of right female breast (HCC) 07/26/2023   Genetic testing 07/25/2023   REFERRING PROVIDER: Dr. Emelia Loron  REFERRING DIAG: Right breast cancer  THERAPY DIAG:  Malignant neoplasm of upper-outer quadrant of right breast in female, estrogen receptor positive (HCC)  Abnormal posture  Aftercare following surgery for neoplasm  Rationale for Evaluation and Treatment: Rehabilitation  ONSET DATE: 08/16/2023  SUBJECTIVE:                                                                                                                                                                                           SUBJECTIVE STATEMENT: Patient reports she underwent a right lumpectomy and sentinel node biopsy (5 negative nodes) on 08/16/2023. Her Oncotype score was 20 so she will proceed to radiation and anti-estrogen therapy. The nerve pain is the worst.  In the last week I started to have more pain in the breast.  More of a zing pain.  Maybe it does feel a little swollen.    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 She  underwent a right  lumpectomy and sentinel node biopsy (5 negative nodes) on 08/16/2023.  It is ER/PR positive and HER2 negative with a Ki67 of 10%.   PATIENT GOALS:  Reassess how my recovery is going related to arm function, pain, and swelling.  PAIN:  Are you having pain? No Its just like a little burn The other pains are more random zings.  No pattern to it.    PRECAUTIONS: Recent Surgery, right UE Lymphedema risk  RED FLAGS: None   ACTIVITY LEVEL / LEISURE: She plays pickleball, golfs, and walks - each once a week.     OBJECTIVE:   PATIENT SURVEYS:  QUICK DASH: 29%   OBSERVATIONS: Incisions healing well  POSTURE:  ***  LYMPHEDEMA ASSESSMENT:  UPPER EXTREMITY AROM/PROM:   A/PROM RIGHT   eval   RIGHT 09/05/2023  Shoulder extension 52 45  Shoulder flexion 159 160  Shoulder abduction 171 170  Shoulder internal rotation 69 70  Shoulder external rotation 90 89                          (Blank rows = not tested)   A/PROM LEFT   eval  Shoulder extension 54  Shoulder flexion 155  Shoulder abduction 168  Shoulder internal rotation 56  Shoulder external rotation 80                          (Blank rows = not tested)   CERVICAL AROM: All within normal limits   UPPER EXTREMITY STRENGTH: WNL   LYMPHEDEMA ASSESSMENTS (in cm):    LANDMARK RIGHT   eval RIGHT 09/05/2023  10 cm proximal to olecranon process 30.8 30.8  Olecranon process 24.8 25  10  cm proximal to ulnar styloid process 24.2 22.6  Just proximal to ulnar styloid process 15.4 15.4  Across hand at thumb web space 16.8 17  At base of 2nd digit 6.2 6.9  (Blank rows = not tested)   LANDMARK LEFT   eval LEFT 09/05/2023  10 cm proximal to olecranon process 30.7 30.6  Olecranon process 24.8   10 cm proximal to ulnar styloid process 22.8 22.5  Just proximal to ulnar styloid process 15.1   Across hand at thumb web space 16.9   At base of 2nd digit 6   (Blank rows = not tested)    Surgery type/Date:  08/16/2023 Number of lymph nodes removed: 5 Current/past treatment (chemo, radiation, hormone therapy): none Other symptoms:  Heaviness/tightness {yes/no:20286} Pain {yes/no:20286} Pitting edema {yes/no:20286} Infections {yes/no:20286} Decreased scar mobility {yes/no:20286} Stemmer sign {yes/no:20286}  PATIENT EDUCATION:  Education details: *** Person educated: {Person educated:25204} Education method: {Education Method:25205} Education comprehension: {Education Comprehension:25206}  HOME EXERCISE PROGRAM: Reviewed previously given post op HEP. ***  ASSESSMENT:  CLINICAL IMPRESSION: ***  Pt will benefit from skilled therapeutic intervention to improve on the following deficits: Decreased knowledge of precautions, impaired UE functional use, pain, decreased ROM, postural dysfunction.   PT treatment/interventions: ADL/Self care home management, {rehab planned interventions:25118::"97110-Therapeutic exercises","97530- Therapeutic [id number]- Neuromuscular re-education","97535- Self UVOZ","36644- Manual therapy"}   GOALS: Goals reviewed with patient? Yes  LONG TERM GOALS:  (STG=LTG)  GOALS Name Target Date  Goal status  1 Pt will demonstrate she has regained full shoulder ROM and function post operatively compared to baselines.  Baseline: 09/21/2023 INITIAL  2  *** INITIAL  3  *** INITIAL  4  *** INITIAL     PLAN:  PT FREQUENCY/DURATION: ***  PLAN FOR NEXT SESSION: ***   [facility]  [address]  [place] [address]  [phone]  After Breast Cancer Class It is recommended you attend the ABC class to be educated on lymphedema risk reduction. This class is free of charge and lasts for 1 hour. It is a 1-time class. You will need to download the TEAMS app either on your phone or computer. We will send you a link the night before or the morning of the class. You should be able to click on that link to join the class. This is  not a confidential class. You don't have to turn your camera on, but other participants may be able to see your email address.  Scar massage You can begin gentle scar massage to you incision sites. Gently place one hand on the incision and move the skin (without sliding on the skin) in various directions. Do this for a few minutes and then you can gently massage either coconut oil or vitamin E cream into the scars. -silicone scar sheets   Compression garment You should continue wearing your compression bra until you feel like you no longer have swelling.  Home exercise Program Continue doing the exercises you were given until you feel like you can do them without feeling any tightness at the end. Keep stretching during and throughout radiation.   Walking Program Studies show that 30 minutes of walking per day (fast enough to elevate your heart rate) can significantly reduce the risk of a cancer recurrence. If you can't walk due to other medical reasons, we encourage you to find another activity you could do (like a stationary bike or water exercise).  Posture After breast cancer surgery, people frequently sit with rounded shoulders posture because it puts their incisions on slack and feels better. If you sit like this and scar tissue forms in that position, you can become very tight and have pain sitting or standing with good posture. Try to be aware of your posture and sit and stand up tall to heal properly.  Follow up PT: It is recommended you return every 3 months for the first 3 years following surgery to be assessed on the SOZO machine for an L-Dex score. This helps prevent clinically significant lymphedema in 95% of patients. These follow up screens are 10 minute appointments that you are not billed for.  Ardit Danh,MARTI COOPER, PT 09/05/2023, 8:55 AM

## 2023-09-05 NOTE — Therapy (Addendum)
 OUTPATIENT PHYSICAL THERAPY BREAST CANCER POST OP FOLLOW UP   Patient Name: Victoria Garza MRN: [id number] DOB:12/10/1970, 52 y.o., female Today's Date: 09/05/2023  END OF SESSION:  PT End of Session - 09/05/23 1037     Visit Number 2    Number of Visits 2    Date for PT Re-Evaluation 09/21/23    PT Start Time 1000    PT Stop Time 1036    PT Time Calculation (min) 36 min    Activity Tolerance Patient tolerated treatment well    Behavior During Therapy [facility] for tasks assessed/performed             Past Medical History:  Diagnosis Date   Breast cancer [facility])    Past Surgical History:  Procedure Laterality Date   BREAST BIOPSY Left 06/05/2007   BREAST BIOPSY Right 06/28/2010   BREAST LUMPECTOMY WITH RADIOACTIVE SEED AND SENTINEL LYMPH NODE BIOPSY Right 08/16/2023   Procedure: RIGHT BREAST SEED BRACKETED LUMPECTOMY, RIGHT AXILLARY SENTINEL NODE BIOPSY;  Surgeon: Enid Harry, MD;  Location: [facility];  Service: General;  Laterality: Right;  PEC BLOCK   THYROIDECTOMY     Patient Active Problem List   Diagnosis Date Noted   Malignant neoplasm of upper-outer quadrant of right female breast (HCC) 07/26/2023   Genetic testing 07/25/2023   REFERRING PROVIDER: Dr. Enid Harry  REFERRING DIAG: Right breast cancer  THERAPY DIAG:  Malignant neoplasm of upper-outer quadrant of right breast in female, estrogen receptor positive (HCC)  Abnormal posture  Aftercare following surgery for neoplasm  Rationale for Evaluation and Treatment: Rehabilitation  ONSET DATE: 08/16/2023  SUBJECTIVE:                                                                                                                                                                                           SUBJECTIVE STATEMENT: Patient reports she underwent a right lumpectomy and sentinel node biopsy (5 negative nodes) on 08/16/2023. Her Oncotype score was 20 so she will proceed to  radiation and anti-estrogen therapy. The nerve pain is the worst.  In the last week I started to have more pain in the breast.  More of a zing pain.  Maybe it does feel a little swollen but I use my pillow and ice packs.   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 She  underwent a right lumpectomy and sentinel node biopsy (5 negative nodes) on 08/16/2023.  It is ER/PR positive and HER2 negative with a Ki67 of 10%.   PATIENT GOALS:  Reassess how my recovery is going related to  arm function, pain, and swelling.  PAIN:  Are you having pain? No Its just like a little burn The other pains are more random zings.  No pattern to it.    PRECAUTIONS: Recent Surgery, right UE Lymphedema risk  RED FLAGS: None   ACTIVITY LEVEL / LEISURE: She plays pickleball, golfs, and walks - each once a week.     OBJECTIVE:   PATIENT SURVEYS:  QUICK DASH: 29% from 2%  OBSERVATIONS: Incisions healing well. Axillary incision more raised, Lumpectomy incision with some redness and irritation but more from friction.    POSTURE:  WNL  LYMPHEDEMA ASSESSMENT:   UPPER EXTREMITY AROM/PROM:   A/PROM RIGHT   eval   RIGHT 09/05/2023  Shoulder extension 52 45  Shoulder flexion 159 160  Shoulder abduction 171 170  Shoulder internal rotation 69 70  Shoulder external rotation 90 89                          (Blank rows = not tested)   A/PROM LEFT   eval  Shoulder extension 54  Shoulder flexion 155  Shoulder abduction 168  Shoulder internal rotation 56  Shoulder external rotation 80                          (Blank rows = not tested)   CERVICAL AROM: All within normal limits   UPPER EXTREMITY STRENGTH: WNL   LYMPHEDEMA ASSESSMENTS (in cm):    LANDMARK RIGHT   eval RIGHT 09/05/2023  10 cm proximal to olecranon process 30.8 30.8  Olecranon process 24.8 25  10  cm proximal to ulnar styloid process 24.2 22.6  Just proximal to  ulnar styloid process 15.4 15.4  Across hand at thumb web space 16.8 17  At base of 2nd digit 6.2 6.9  (Blank rows = not tested)   LANDMARK LEFT   eval LEFT 09/05/2023  10 cm proximal to olecranon process 30.7 30.6  Olecranon process 24.8 24.8  10 cm proximal to ulnar styloid process 22.8 22.5  Just proximal to ulnar styloid process 15.1 15.1  Across hand at thumb web space 16.9 16.9  At base of 2nd digit 6 6.5  (Blank rows = not tested)    Surgery type/Date: 08/16/2023 Number of lymph nodes removed: 5 Current/past treatment (chemo, radiation, hormone therapy): none  PATIENT EDUCATION:  Education details: post op section per below Person educated: Patient Education method: Medical illustrator, vcs,  Education comprehension: verbalized understanding  HOME EXERCISE PROGRAM: Reviewed previously given post op HEP. Discussed return to golf and pickleball and trainer as tolerated and slowly  ASSESSMENT:  CLINICAL IMPRESSION: Pt returns 3 weeks post Rt lumpectomy with return to full AROM.  She is mainly having intermittent nerve pain in the axilla and breast.  Her incisions are healing well and she was educated on silicone scar sheets for the raised axilla.  Discussed lymphedema risk reduction, pt is signed up for ABC class, and SOZO screens.    Pt will benefit from skilled therapeutic intervention to improve on the following deficits: Decreased knowledge of precautions, impaired UE functional use, pain, decreased ROM, postural dysfunction.   PT treatment/interventions: ADL/Self care home management, [phone]- PT Re-evaluation and 60454- Self Care   GOALS: Goals reviewed with patient? Yes  LONG TERM GOALS:  (STG=LTG)  GOALS Name Target Date  Goal status  1 Pt will demonstrate she has regained full shoulder ROM and function post operatively  compared to baselines.  Baseline: 09/21/2023 MET                    PLAN:  PT FREQUENCY/DURATION: SOZO screens  only  PLAN FOR NEXT SESSION: SOZO screens only   [facility]  [address], [address]  [place] [address]  [phone]  After Breast Cancer Class It is recommended you attend the ABC class to be educated on lymphedema risk reduction. This class is free of charge and lasts for 1 hour. It is a 1-time class. You will need to download the TEAMS app either on your phone or computer. We will send you a link the night before or the morning of the class. You should be able to click on that link to join the class. This is not a confidential class. You don't have to turn your camera on, but other participants may be able to see your email address.  Scar massage You can begin gentle scar massage to you incision sites. Gently place one hand on the incision and move the skin (without sliding on the skin) in various directions. Do this for a few minutes and then you can gently massage either coconut oil or vitamin E cream into the scars. -silicone scar sheets   Compression garment You should continue wearing your compression bra until you feel like you no longer have swelling.  Home exercise Program Continue doing the exercises you were given until you feel like you can do them without feeling any tightness at the end. Keep stretching during and throughout radiation.   Walking Program Studies show that 30 minutes of walking per day (fast enough to elevate your heart rate) can significantly reduce the risk of a cancer recurrence. If you can't walk due to other medical reasons, we encourage you to find another activity you could do (like a stationary bike or water exercise).  Posture After breast cancer surgery, people frequently sit with rounded shoulders posture because it puts their incisions on slack and feels better. If you sit like this and scar tissue forms in that position, you can become very tight and have pain sitting or standing with good posture. Try to be aware of  your posture and sit and stand up tall to heal properly.  Follow up PT: It is recommended you return every 3 months for the first 3 years following surgery to be assessed on the SOZO machine for an L-Dex score. This helps prevent clinically significant lymphedema in 95% of patients. These follow up screens are 10 minute appointments that you are not billed for.  Encarnacion Mccammon, PT 09/05/2023, 10:38 AM  PHYSICAL THERAPY DISCHARGE SUMMARY  Visits from Start of Care: 2  Current functional level related to goals / functional outcomes: No return for SOZO screenings. Will be discharged.

## 2023-09-08 ENCOUNTER — Encounter: Payer: Self-pay | Admitting: *Deleted

## 2023-09-20 ENCOUNTER — Encounter: Payer: Self-pay | Admitting: *Deleted

## 2023-09-20 DIAGNOSIS — Z170 Estrogen receptor positive status [ER+]: Secondary | ICD-10-CM

## 2023-10-03 ENCOUNTER — Telehealth: Payer: Self-pay | Admitting: *Deleted

## 2023-10-03 NOTE — Telephone Encounter (Signed)
Received after hours message from pt with complaint of body aches, stuffy nose, fatigue and vomiting.  RN attempt x1 to return call to pt.  No answer, LVM for pt to return call to the office.

## 2023-11-21 ENCOUNTER — Inpatient Hospital Stay: Payer: 59 | Attending: Hematology and Oncology | Admitting: Hematology and Oncology

## 2023-11-21 VITALS — BP 122/64 | HR 75 | Temp 98.3°F | Resp 18 | Ht 62.0 in | Wt 161.8 lb

## 2023-11-21 DIAGNOSIS — R5383 Other fatigue: Secondary | ICD-10-CM | POA: Diagnosis not present

## 2023-11-21 DIAGNOSIS — C50411 Malignant neoplasm of upper-outer quadrant of right female breast: Secondary | ICD-10-CM

## 2023-11-21 DIAGNOSIS — N951 Menopausal and female climacteric states: Secondary | ICD-10-CM | POA: Diagnosis not present

## 2023-11-21 DIAGNOSIS — Z170 Estrogen receptor positive status [ER+]: Secondary | ICD-10-CM | POA: Diagnosis not present

## 2023-11-21 DIAGNOSIS — Z923 Personal history of irradiation: Secondary | ICD-10-CM | POA: Insufficient documentation

## 2023-11-21 MED ORDER — TAMOXIFEN CITRATE 20 MG PO TABS: 20.0000 mg | ORAL_TABLET | Freq: Every day | ORAL | 3 refills | Status: DC

## 2023-11-21 MED ORDER — VENLAFAXINE HCL ER 37.5 MG PO CP24: 37.5000 mg | ORAL_CAPSULE | Freq: Every day | ORAL | 3 refills | Status: DC

## 2023-11-21 NOTE — Progress Notes (Signed)
Patient Care Team: Noralee Chars as PCP - Delford Field, RN as Oncology Nurse Navigator Donnelly Angelica, RN as Oncology Nurse Navigator Serena Croissant, MD as Consulting Physician (Hematology and Oncology) Dorothy Puffer, MD as Consulting Physician (Radiation Oncology) Emelia Loron, MD as Consulting Physician (General Surgery)  DIAGNOSIS:  Encounter Diagnosis  Name Primary?   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, ER 95%, PR 100%, Ki67 10%, HER2 negative   08/16/2023 Surgery   Right lumpectomy: 2 foci of grade 2 ILC 3.5 cm and 1.2 cm, LCIS, DCIS intermediate grade, no lymphovascular invasion, 0/5 lymph nodes, margins negative, ER 95%, PR 100%, HER2 1+ negative, Ki-67 10%   08/16/2023 Oncotype testing   Oncotype DX score: 20 (6% risk of distant recurrence at 9 years)   08/29/2023 Cancer Staging   Staging form: Breast, AJCC 8th Edition - Pathologic: Stage IA (pT2, pN0, cM0, G2, ER+, PR+, HER2-) - Signed by Serena Croissant, MD on 08/29/2023 Histologic grading system: 3 grade system   10/06/2023 - 11/02/2023 Radiation Therapy   R Breast 10/06/2023-10/31/2023 Tangents - Prone 266 / 266 cGy 16 / 16 4,256 / 4,256 cGy  R Breast Bst 11/02/2023-11/02/2023 3-D CRT 250 / 250 cGy 1 / 4 250 / 1,000 cGy      CHIEF COMPLIANT: Follow-up to discuss the anti estrogen therapy plan  HISTORY OF PRESENT ILLNESS:  History of Present Illness   The  patient, with breast cancer, presents for follow-up after completing radiation therapy.  She has been experiencing significant fatigue, describing herself as 'absolutely exhausted' during the last week of treatment. Her breast is 'very, very sore' and 'tender' with redness, but not severely burnt. She also has a slight sore throat at the end of the day, which she associates with exhaustion.  She reports gastrointestinal discomfort, describing it as 'belchy' and likening it to early pregnancy symptoms. This discomfort began in the last two weeks of her treatment.  Her menstrual cycles have been irregular for the past two years, with the last cycle occurring in November, lasting only a day or two. She currently takes a low dose of Prozac for hot flashes, which she notes has been effective. She experiences hot flashes and restless leg syndrome, which she associates with perimenopausal symptoms.  She has a history of numbness in her armpit, described as a 'weird feeling' but not uncomfortable. She also experiences occasional 'stabby pain' in the breast area, which can be exacerbated by physical activity.         ALLERGIES:  has no known allergies.  MEDICATIONS:  Current Outpatient Medications  Medication Sig Dispense Refill   tamoxifen (NOLVADEX) 20 MG tablet Take 1 tablet (20 mg total) by mouth daily. 90 tablet 3   venlafaxine XR (EFFEXOR-XR) 37.5 MG 24 hr capsule Take 1 capsule (37.5 mg total) by mouth daily with breakfast. 90 capsule 3   levothyroxine (SYNTHROID) 125 MCG tablet Take 125 mcg by mouth daily before breakfast.     loratadine (CLARITIN) 10 MG tablet Take 10  mg by mouth daily. [employer] brand AllerClear     No current facility-administered medications for this visit.    PHYSICAL EXAMINATION: ECOG PERFORMANCE STATUS: 1 - Symptomatic but completely ambulatory  Vitals:   11/21/23 1034  BP: 122/64  Pulse: 75  Resp: 18  Temp: 98.3 F (36.8 C)  SpO2: 100%   Filed Weights    11/21/23 1034  Weight: 161 lb 12.8 oz (73.4 kg)      LABORATORY DATA:  I have reviewed the data as listed    Latest Ref Rng & Units 07/27/2023   12:58 PM 06/23/2018   10:57 AM  CMP  Glucose 70 - 99 mg/dL 630  93   BUN 6 - 20 mg/dL 14  16   Creatinine 1.60 - 1.00 mg/dL 1.09  3.23   Sodium 557 - 145 mmol/L 140  138   Potassium 3.5 - 5.1 mmol/L 3.6  3.8   Chloride 98 - 111 mmol/L 104  105   CO2 22 - 32 mmol/L 30  23   Calcium 8.9 - 10.3 mg/dL 9.4  8.9   Total Protein 6.5 - 8.1 g/dL 7.8  7.6   Total Bilirubin 0.3 - 1.2 mg/dL 0.5  0.9   Alkaline Phos 38 - 126 U/L 62  38   AST 15 - 41 U/L 18  20   ALT 0 - 44 U/L 15  14     Lab Results  Component Value Date   WBC 7.6 07/27/2023   HGB 14.5 07/27/2023   HCT 42.9 07/27/2023   MCV 89.2 07/27/2023   PLT 339 07/27/2023   NEUTROABS 5.0 07/27/2023    ASSESSMENT & PLAN:  Malignant neoplasm of upper-outer quadrant of right female breast (HCC) 08/16/2023: Right lumpectomy: 2 foci of grade 2 ILC 3.5 cm and 1.2 cm, LCIS, DCIS intermediate grade, no lymphovascular invasion, 0/5 lymph nodes, margins negative, ER 95%, PR 100%, HER2 1+ negative, Ki-67 10%  Oncotype DX score 20 (6% risk of distant recurrence at 9 years)   Treatment plan: Adjuvant radiation therapy 10/06/2023-11/02/2023: Radiation at [facility] Adjuvant antiestrogen therapy with tamoxifen started 11/23/2023   Breast cancer surveillance: Contrast-enhanced mammograms annually.  Guardant reveal for MRD monitoring.     Return to clinic in 3 months for survivorship care plan visit ------------------------------------- Assessment and Plan    Hormone Receptor-Positive Breast Cancer Completed radiation therapy, transitioning to hormone therapy. Tamoxifen recommended initially due to irregular menstrual cycles. Discussed risks (hot flashes, mood swings, muscle cramps, rare blood clots, uterine thickening) and benefits (improved bone density, reduced cholesterol). Plan to switch to  anastrozole post-menopause. Prozac to be replaced with Effexor for hot flashes. Risk of recurrence: 6% over ten years with tamoxifen for five years, potentially lower with extended use. - Prescribe tamoxifen 20 mg daily, starting with half dose for the first two weeks - Switch from Prozac to Effexor 37.5 mg daily for hot flashes - Schedule follow-up in three months with Mardella Layman nurse practitioner  SCP visit - Set up GUARDENT reveal test every six months for detection of microscopic DNA  Post-Radiation Fatigue Significant fatigue post-radiation, expected to improve within two weeks. - Advise rest and gradual return to normal activities  Radiation-Induced Breast Soreness Significant breast soreness and tenderness post-radiation, with redness but no severe burns. - Advise use of heating pads for muscle soreness - Monitor for signs of infection or severe skin reactions  General Health Maintenance Discussed diet and exercise for overall health and cancer survivorship. - Schedule survivorship care  plan appointment in three months to discuss diet, exercise, and next steps in cancer surveillance  Follow-up - Schedule follow-up appointment in three months with nurse practitioner - Order next mammogram and alternating MRIs or contrast-enhanced mammograms - Set up GUARDENT reveal test every six months.          No orders of the defined types were placed in this encounter.  The patient has a good understanding of the overall plan. she agrees with it. she will call with any problems that may develop before the next visit here. Total time spent: 30 mins including face to face time and time spent for planning, charting and co-ordination of care   Victoria Meek, MD 11/21/23

## 2023-11-21 NOTE — Assessment & Plan Note (Addendum)
08/16/2023: Right lumpectomy: 2 foci of grade 2 ILC 3.5 cm and 1.2 cm, LCIS, DCIS intermediate grade, no lymphovascular invasion, 0/5 lymph nodes, margins negative, ER 95%, PR 100%, HER2 1+ negative, Ki-67 10%  Oncotype DX score 20 (6% risk of distant recurrence at 9 years)   Treatment plan: Adjuvant radiation therapy 10/06/2023-11/02/2023: Radiation at [facility] Adjuvant antiestrogen therapy based upon her menopausal status    We discussed extensively about surveillance for breast cancer after she completes treatment which would be with mammograms alternating with MRIs.   We would also consider doing guardant reveal for minimal residual disease. Return to clinic in 3 months for survivorship care plan visit

## 2023-11-22 ENCOUNTER — Telehealth: Payer: Self-pay

## 2023-11-22 NOTE — Telephone Encounter (Signed)
 Per md orders entered for Guardant Reveal and all supported documents faxed to [phone]. Faxed confirmation was received.

## 2023-11-28 ENCOUNTER — Ambulatory Visit: Payer: 59

## 2023-12-08 ENCOUNTER — Telehealth: Payer: Self-pay | Admitting: *Deleted

## 2023-12-08 ENCOUNTER — Encounter: Payer: Self-pay | Admitting: Hematology and Oncology

## 2023-12-08 NOTE — Telephone Encounter (Signed)
 Per MD request RN placed call to pt with recent Guardant Reveal results being negative.  Pt educated and verbalized understanding.

## 2023-12-13 ENCOUNTER — Telehealth: Payer: Self-pay | Admitting: *Deleted

## 2023-12-13 NOTE — Telephone Encounter (Signed)
 Received call from pt with complain to of severe drowisness and body aches while talking Tamoxifen, Effexor, and Prozac.  RN reviewed with MD and pt was educated to take Prozac every other day x 1 week and then completely stop.  Once Prozac is stopped, pt to start Effexor.  RN re-educated pt who verbalized understanding.

## 2023-12-22 ENCOUNTER — Telehealth: Payer: Self-pay | Admitting: *Deleted

## 2023-12-22 NOTE — Telephone Encounter (Signed)
 Received call from pt with complaint of lower abdominal cramping after starting Tamoxifen 1/2 tablet daily for 1 week.  Pt describes cramping as menstrual cramps. Per MD symptoms are common with starting Tamoxifen.  Pt educated to monitor cramping and contact our office if symptoms become severe.  Pt educated and verbalized understanding.

## 2023-12-28 ENCOUNTER — Telehealth: Payer: Self-pay | Admitting: *Deleted

## 2023-12-28 NOTE — Telephone Encounter (Signed)
 Received updated call from pt stating she has thought more about the Effexor and is willing to give it a chance.  Pt educated to update our office if she wishes to change therapy.  Pt verbalized understanding.

## 2023-12-28 NOTE — Telephone Encounter (Signed)
 Received call from pt stating she is wanting to come off Effexor for hot flashes and discuss other options for hot flashes/ antidepressant therapy with MD.  Telephone visit scheduled for further discussion.

## 2023-12-29 ENCOUNTER — Telehealth: Admitting: Hematology and Oncology

## 2024-01-27 ENCOUNTER — Telehealth: Payer: Self-pay | Admitting: Adult Health

## 2024-01-27 NOTE — Telephone Encounter (Signed)
 Left vm for pt about rescheduled appt

## 2024-02-23 ENCOUNTER — Encounter: Payer: 59 | Admitting: Adult Health

## 2024-02-24 ENCOUNTER — Inpatient Hospital Stay: Attending: Hematology and Oncology | Admitting: Adult Health

## 2024-02-24 VITALS — BP 126/63 | HR 106 | Temp 98.1°F | Resp 17 | Wt 169.1 lb

## 2024-02-24 DIAGNOSIS — Z79810 Long term (current) use of selective estrogen receptor modulators (SERMs): Secondary | ICD-10-CM | POA: Insufficient documentation

## 2024-02-24 DIAGNOSIS — Z803 Family history of malignant neoplasm of breast: Secondary | ICD-10-CM | POA: Insufficient documentation

## 2024-02-24 DIAGNOSIS — Z808 Family history of malignant neoplasm of other organs or systems: Secondary | ICD-10-CM | POA: Diagnosis not present

## 2024-02-24 DIAGNOSIS — R635 Abnormal weight gain: Secondary | ICD-10-CM | POA: Diagnosis not present

## 2024-02-24 DIAGNOSIS — Z170 Estrogen receptor positive status [ER+]: Secondary | ICD-10-CM

## 2024-02-24 DIAGNOSIS — C50411 Malignant neoplasm of upper-outer quadrant of right female breast: Secondary | ICD-10-CM

## 2024-02-24 DIAGNOSIS — R232 Flushing: Secondary | ICD-10-CM | POA: Diagnosis not present

## 2024-02-24 MED ORDER — VENLAFAXINE HCL ER 37.5 MG PO CP24: 75.0000 mg | ORAL_CAPSULE | Freq: Every day | ORAL | 3 refills | Status: DC

## 2024-02-24 NOTE — Progress Notes (Unsigned)
 SURVIVORSHIP VISIT:  BRIEF ONCOLOGIC HISTORY:  Oncology History  Malignant neoplasm of upper-outer quadrant of right female breast (HCC)  07/20/2023 Initial Diagnosis   Screening mammogram detected right breast UOQ architectural distortion 1.5 cm, contrast-enhanced mammogram revealed 1.1 cm mass with 2.5 cm non-mass enhancement (positive for intermediate grade DCIS with necrosis) the primary mass biopsy came back as grade 2 invasive mammary cancer with ductal and lobular features, ER 95%, PR 100%, Ki67 10%, HER2 negative   08/16/2023 Surgery   Right lumpectomy: 2 foci of grade 2 ILC 3.5 cm and 1.2 cm, LCIS, DCIS intermediate grade, no lymphovascular invasion, 0/5 lymph nodes, margins negative, ER 95%, PR 100%, HER2 1+ negative, Ki-67 10%   08/16/2023 Oncotype testing   Oncotype DX score: 20 (6% risk of distant recurrence at 9 years)   08/29/2023 Cancer Staging   Staging form: Breast, AJCC 8th Edition - Pathologic: Stage IA (pT2, pN0, cM0, G2, ER+, PR+, HER2-) - Signed by Cameron Cea, MD on 08/29/2023 Histologic grading system: 3 grade system   10/06/2023 - 11/02/2023 Radiation Therapy   R Breast 10/06/2023-10/31/2023 Tangents - Prone 266 / 266 cGy 16 / 16 4,256 / 4,256 cGy  R Breast Bst 11/02/2023-11/02/2023 3-D CRT 250 / 250 cGy 1 / 4 250 / 1,000 cGy    11/2023 -  Anti-estrogen oral therapy   20 mg Tamoxifen      INTERVAL HISTORY:  Victoria Garza to review her survivorship care plan detailing her treatment course for breast cancer, as well as monitoring long-term side effects of that treatment, education regarding health maintenance, screening, and overall wellness and health promotion.     Overall, Victoria Garza reports feeling quite well.  She is struggling with Tamoxifen  and whether it is causing increased weight gain.  She has gained 40 pounds in the past two years, and the last 10 have been while taking tamoxifen .  This has been very frustrating for her and she wants to know what she might  be able to to help modify this.  REVIEW OF SYSTEMS:  Review of Systems  Constitutional:  Negative for appetite change, chills, fatigue, fever and unexpected weight change.  HENT:   Negative for hearing loss, lump/mass and trouble swallowing.   Eyes:  Negative for eye problems and icterus.  Respiratory:  Negative for chest tightness, cough and shortness of breath.   Cardiovascular:  Negative for chest pain, leg swelling and palpitations.  Gastrointestinal:  Negative for abdominal distention, abdominal pain, constipation, diarrhea, nausea and vomiting.  Endocrine: Positive for hot flashes.  Genitourinary:  Negative for difficulty urinating.   Musculoskeletal:  Negative for arthralgias.  Skin:  Negative for itching and rash.  Neurological:  Negative for dizziness, extremity weakness, headaches and numbness.  Hematological:  Negative for adenopathy. Does not bruise/bleed easily.  Psychiatric/Behavioral:  Negative for depression. The patient is not nervous/anxious.   Breast: Denies any new nodularity, masses, tenderness, nipple changes, or nipple discharge.       PAST MEDICAL/SURGICAL HISTORY:  Past Medical History:  Diagnosis Date  . Breast cancer [facility])    Past Surgical History:  Procedure Laterality Date  . BREAST BIOPSY Left 06/05/2007  . BREAST BIOPSY Right 06/28/2010  . BREAST LUMPECTOMY WITH RADIOACTIVE SEED AND SENTINEL LYMPH NODE BIOPSY Right 08/16/2023   Procedure: RIGHT BREAST SEED BRACKETED LUMPECTOMY, RIGHT AXILLARY SENTINEL NODE BIOPSY;  Surgeon: Enid Harry, MD;  Location: [facility];  Service: General;  Laterality: Right;  PEC BLOCK  . THYROIDECTOMY  ALLERGIES:  Allergies  Allergen Reactions  . Erythromycin Nausea And Vomiting, Nausea Only and Other (See Comments)    Patient reports she can tolerate ZITHROMAX     CURRENT MEDICATIONS:  Outpatient Encounter Medications as of 02/24/2024  Medication Sig  . levothyroxine (SYNTHROID) 125  MCG tablet Take 125 mcg by mouth daily before breakfast.  . loratadine (CLARITIN) 10 MG tablet Take 10 mg by mouth daily. [employer]  . tamoxifen  (NOLVADEX ) 20 MG tablet Take 1 tablet (20 mg total) by mouth daily.  . venlafaxine  XR (EFFEXOR -XR) 37.5 MG 24 hr capsule Take 1 capsule (37.5 mg total) by mouth daily with breakfast.   No facility-administered encounter medications on file as of 02/24/2024.     ONCOLOGIC FAMILY HISTORY:  Family History  Problem Relation Age of Onset  . Brain cancer Father   . Breast cancer Sister 42     SOCIAL HISTORY:  Social History   Socioeconomic History  . Marital status: Divorced    Spouse name: Not on file  . Number of children: Not on file  . Years of education: Not on file  . Highest education level: Not on file  Occupational History  . Not on file  Tobacco Use  . Smoking status: Never  . Smokeless tobacco: Never  Substance and Sexual Activity  . Alcohol use: Yes    Alcohol/week: 1.0 standard drink of alcohol    Types: 1 Glasses of wine per week  . Drug use: Never  . Sexual activity: Not on file  Other Topics Concern  . Not on file  Social History Narrative  . Not on file   Social Drivers of Health   Financial Resource Strain: Not on file  Food Insecurity: No Food Insecurity (07/27/2023)   Hunger Vital Sign   . Worried About Programme researcher, broadcasting/film/video in the Last Year: Never true   . Ran Out of Food in the Last Year: Never true  Transportation Needs: No Transportation Needs (07/27/2023)   PRAPARE - Transportation   . Lack of Transportation (Medical): No   . Lack of Transportation (Non-Medical): No  Physical Activity: Not on file  Stress: Not on file  Social Connections: Not on file  Intimate Partner Violence: Not At Risk (07/27/2023)   Humiliation, Afraid, Rape, and Kick questionnaire   . Fear of Current or Ex-Partner: No   . Emotionally Abused: No   . Physically Abused: No   . Sexually Abused: No      OBSERVATIONS/OBJECTIVE:  BP 126/63 (BP Location: Left Arm, Patient Position: Sitting)   Pulse (!) 106   Temp 98.1 F (36.7 C) (Temporal)   Resp 17   Wt 169 lb 1.6 oz (76.7 kg)   SpO2 99%   BMI 30.93 kg/m  GENERAL: Patient is a well appearing female in no acute distress HEENT:  Sclerae anicteric.  Oropharynx clear and moist. No ulcerations or evidence of oropharyngeal candidiasis. Neck is supple.  NODES:  No cervical, supraclavicular, or axillary lymphadenopathy palpated.  BREAST EXAM:  Deferred. LUNGS:  Clear to auscultation bilaterally.  No wheezes or rhonchi. HEART:  Regular rate and rhythm. No murmur appreciated. ABDOMEN:  Soft, nontender.  Positive, normoactive bowel sounds. No organomegaly palpated. MSK:  No focal spinal tenderness to palpation. Full range of motion bilaterally in the upper extremities. EXTREMITIES:  No peripheral edema.   SKIN:  Clear with no obvious rashes or skin changes. No nail dyscrasia. NEURO:  Nonfocal. Well oriented.  Appropriate affect.   LABORATORY  DATA:  None for this visit.  DIAGNOSTIC IMAGING:  None for this visit.      ASSESSMENT AND PLAN:  Victoria Garza is a pleasant 53 y.o. female with Stage *** right/left breast invasive ductal carcinoma, ER+/PR+/HER2-, diagnosed in ***, treated with lumpectomy, adjuvant radiation therapy, and anti-estrogen therapy with *** beginning in ***.  She presents to the Survivorship Clinic for our initial meeting and routine follow-up post-completion of treatment for breast cancer.    1. Stage *** right/left breast cancer:  Victoria Garza is continuing to recover from definitive treatment for breast cancer. She will follow-up with her medical oncologist, Dr.  Aaron Aas with history and physical exam per surveillance protocol.  She will continue her anti-estrogen therapy with ***. Thus far, she is tolerating the *** well, with minimal side effects. Her mammogram is due ***; orders placed today.   Today, a comprehensive  survivorship care plan and treatment summary was reviewed with the patient today detailing her breast cancer diagnosis, treatment course, potential late/long-term effects of treatment, appropriate follow-up care with recommendations for the future, and patient education resources.  A copy of this summary, along with a letter will be sent to the patient's primary care provider via mail/fax/In Basket message after today's visit.    #. Problem(s) at Visit______________  #. Bone health:  Given Victoria Garza's age/history of breast cancer and her current treatment regimen including anti-estrogen therapy with ***, she is at risk for bone demineralization.  Her last DEXA scan was ***, which showed ***.  In the meantime, she was encouraged to increase her consumption of foods rich in calcium, as well as increase her weight-bearing activities.  She was given education on specific activities to promote bone health.  #. Cancer screening:  Due to Victoria Garza's history and her age, she should receive screening for skin cancers, colon cancer, and gynecologic cancers.  The information and recommendations are listed on the patient's comprehensive care plan/treatment summary and were reviewed in detail with the patient.    #. Health maintenance and wellness promotion: Victoria Garza was encouraged to consume 5-7 servings of fruits and vegetables per day. We reviewed the "Nutrition Rainbow" handout.  She was also encouraged to engage in moderate to vigorous exercise for 30 minutes per day most days of the week.  She was instructed to limit her alcohol consumption and continue to abstain from tobacco use/***was encouraged stop smoking.     #. Support services/counseling: It is not uncommon for this period of the patient's cancer care trajectory to be one of many emotions and stressors.   She was given information regarding our available services and encouraged to contact me with any questions or for help enrolling in any of our  support group/programs.    Follow up instructions:    -Return to cancer center ***  -Mammogram due in *** -She is welcome to return back to the Survivorship Clinic at any time; no additional follow-up needed at this time.  -Consider referral back to survivorship as a long-term survivor for continued surveillance  The patient was provided an opportunity to ask questions and all were answered. The patient agreed with the plan and demonstrated an understanding of the instructions.   Total encounter time:*** minutes*in face-to-face visit time, chart review, lab review, care coordination, order entry, and documentation of the encounter time.    Alwin Baars, NP 02/24/24 9:06 AM Medical Oncology and Hematology [facility] [address] [place], [place] [address] Tel. [phone]  Fax. [phone]  *Total Encounter Time as defined by the Centers for Medicare and Medicaid Services includes, in addition to the face-to-face time of a patient visit (documented in the note above) non-face-to-face time: obtaining and reviewing outside history, ordering and reviewing medications, tests or procedures, care coordination (communications with other health care professionals or caregivers) and documentation in the medical record.

## 2024-02-27 ENCOUNTER — Telehealth: Payer: Self-pay | Admitting: Hematology and Oncology

## 2024-02-27 ENCOUNTER — Encounter: Payer: Self-pay | Admitting: Adult Health

## 2024-02-27 NOTE — Telephone Encounter (Signed)
 LEFT PATIENT A VM REGARDING UPCOMING APPOINTMENT

## 2024-02-28 ENCOUNTER — Encounter: Payer: Self-pay | Admitting: Obstetrics and Gynecology

## 2024-05-03 ENCOUNTER — Encounter: Payer: Self-pay | Admitting: Adult Health

## 2024-05-16 ENCOUNTER — Telehealth: Payer: Self-pay | Admitting: Adult Health

## 2024-05-16 NOTE — Telephone Encounter (Signed)
 Left patient a vm regarding upcoming appointment

## 2024-05-18 ENCOUNTER — Encounter: Payer: Self-pay | Admitting: Adult Health

## 2024-05-18 ENCOUNTER — Ambulatory Visit: Attending: Hematology and Oncology | Admitting: Adult Health

## 2024-05-18 DIAGNOSIS — C50411 Malignant neoplasm of upper-outer quadrant of right female breast: Secondary | ICD-10-CM | POA: Diagnosis not present

## 2024-05-18 DIAGNOSIS — Z170 Estrogen receptor positive status [ER+]: Secondary | ICD-10-CM | POA: Diagnosis not present

## 2024-05-18 MED ORDER — ESCITALOPRAM OXALATE 5 MG PO TABS: ORAL_TABLET | ORAL | 0 refills | Status: DC

## 2024-05-18 NOTE — Assessment & Plan Note (Signed)
 08/16/2023: Right lumpectomy: 2 foci of grade 2 ILC 3.5 cm and 1.2 cm, LCIS, DCIS intermediate grade, no lymphovascular invasion, 0/5 lymph nodes, margins negative, ER 95%, PR 100%, HER2 1+ negative, Ki-67 10%  Oncotype DX score 20 (6% risk of distant recurrence at 9 years)   Treatment plan: Adjuvant radiation therapy 10/06/2023-11/02/2023: Radiation at [facility] Adjuvant antiestrogen therapy with Tamoxifen   Side effects: Hot flashes: on Effexor  at 37.5 mg daily, ineffective, doesn't want to dose increase, will taper off. She will taper to 37.5 mg every other day the week of 8/18 (due to upcoming travel plans) She was on Fluoxetine which has a clinically significant interaction with Tamoxifen  and it is not an advisable option for her to restart Fluoxetine.   Instead, I prescribed Lexapro at 5 mg daily x 2 weeks to dose increase if tolerated to 10 mg daily.  RTC week of 07/09/2024 to touch base on how Lexapro is going and tolerance to understand refill needs.

## 2024-05-18 NOTE — Progress Notes (Signed)
 [facility] Cancer Follow up:    [name], PA-C [address] [place] [address]   DIAGNOSIS:  Cancer Staging  Malignant neoplasm of upper-outer quadrant of right female breast [facility]) Staging form: Breast, AJCC 8th Edition - Clinical: Stage IA (cT1c, cN0, cM0, G2, ER+, PR+, HER2-) - Unsigned Histologic grading system: 3 grade system - Pathologic: Stage IA (pT2, pN0, cM0, G2, ER+, PR+, HER2-) - Signed by Odean Potts, MD on 08/29/2023 Histologic grading system: 3 grade system  I connected with Larraine Lesches on 05/18/24 at  2:20 PM EDT by telephone and verified that I am speaking with the correct person using two identifiers.  I discussed the limitations, risks, security and privacy concerns of performing an evaluation and management service by telephone and the availability of in person appointments.  I also discussed with the patient that there may be a patient responsible charge related to this service. The patient expressed understanding and agreed to proceed.  Patient location: home Provider Location: [facility] office  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, ER 95%, PR 100%, Ki67 10%, HER2 negative   08/16/2023 Surgery   Right lumpectomy: 2 foci of grade 2 ILC 3.5 cm and 1.2 cm, LCIS, DCIS intermediate grade, no lymphovascular invasion, 0/5 lymph nodes, margins negative, ER 95%, PR 100%, HER2 1+ negative, Ki-67 10%   08/16/2023 Oncotype testing   Oncotype DX score: 20 (6% risk of distant recurrence at 9 years)   08/29/2023 Cancer Staging    Staging form: Breast, AJCC 8th Edition - Pathologic: Stage IA (pT2, pN0, cM0, G2, ER+, PR+, HER2-) - Signed by Odean Potts, MD on 08/29/2023 Histologic grading system: 3 grade system   10/06/2023 - 11/02/2023 Radiation Therapy   R Breast 10/06/2023-10/31/2023 Tangents - Prone 266 / 266 cGy 16 / 16 4,256 / 4,256 cGy  R Breast Bst 11/02/2023-11/02/2023 3-D CRT 250 / 250 cGy 1 / 4 250 / 1,000 cGy    11/2023 -  Anti-estrogen oral therapy   20 mg Tamoxifen      CURRENT THERAPY:Tamoxifen   INTERVAL HISTORY:  Discussed the use of AI scribe software for clinical note transcription with the patient, who gave verbal consent to proceed.  Victoria Garza 53 y.o. female returns for concerns of effexor  on tamoxifen .  She is concerned about the effexor  not working very well.  She is experiencing hot flashes six times a day and 2-4 times at night.  She does not want to increase effexor  due to being aware of it being challenging to stop from others she has known who have taken it.  She wants to know if she can take anything else.     Patient Active Problem List   Diagnosis Date Noted   Malignant neoplasm of upper-outer quadrant of right female breast (HCC) 07/26/2023   Genetic testing 07/25/2023    is allergic to erythromycin.  MEDICAL HISTORY: Past Medical History:  Diagnosis Date   Breast cancer [facility])     SURGICAL HISTORY: Past Surgical History:  Procedure Laterality Date   BREAST BIOPSY Left 06/05/2007   BREAST BIOPSY Right 06/28/2010   BREAST LUMPECTOMY WITH RADIOACTIVE  SEED AND SENTINEL LYMPH NODE BIOPSY Right 08/16/2023   Procedure: RIGHT BREAST SEED BRACKETED LUMPECTOMY, RIGHT AXILLARY SENTINEL NODE BIOPSY;  Surgeon: Ebbie Cough, MD;  Location: [facility];  Service: General;  Laterality: Right;  PEC BLOCK   THYROIDECTOMY      SOCIAL HISTORY: Social History   Socioeconomic History   Marital status: Divorced    Spouse name: Not on file   Number of children:  Not on file   Years of education: Not on file   Highest education level: Not on file  Occupational History   Not on file  Tobacco Use   Smoking status: Never   Smokeless tobacco: Never  Substance and Sexual Activity   Alcohol use: Yes    Alcohol/week: 1.0 standard drink of alcohol    Types: 1 Glasses of wine per week   Drug use: Never   Sexual activity: Not on file  Other Topics Concern   Not on file  Social History Narrative   Not on file   Social Drivers of Health   Financial Resource Strain: Not on file  Food Insecurity: No Food Insecurity (07/27/2023)   Hunger Vital Sign    Worried About Running Out of Food in the Last Year: Never true    Ran Out of Food in the Last Year: Never true  Transportation Needs: No Transportation Needs (07/27/2023)   PRAPARE - Administrator, Civil Service (Medical): No    Lack of Transportation (Non-Medical): No  Physical Activity: Not on file  Stress: Not on file  Social Connections: Not on file  Intimate Partner Violence: Not At Risk (07/27/2023)   Humiliation, Afraid, Rape, and Kick questionnaire    Fear of Current or Ex-Partner: No    Emotionally Abused: No    Physically Abused: No    Sexually Abused: No    FAMILY HISTORY: Family History  Problem Relation Age of Onset   Brain cancer Father    Breast cancer Sister 33    Review of Systems  Constitutional:  Negative for appetite change, chills, fatigue, fever and unexpected weight change.  HENT:   Negative for hearing loss, lump/mass and trouble swallowing.   Eyes:  Negative for eye problems and icterus.  Respiratory:  Negative for chest tightness, cough and shortness of breath.   Cardiovascular:  Negative for chest pain, leg swelling and palpitations.  Gastrointestinal:  Negative for abdominal distention, abdominal pain, constipation, diarrhea, nausea and vomiting.  Endocrine: Negative for hot flashes.  Genitourinary:  Negative for difficulty urinating.    Musculoskeletal:  Negative for arthralgias.  Skin:  Negative for itching and rash.  Neurological:  Negative for dizziness, extremity weakness, headaches and numbness.  Hematological:  Negative for adenopathy. Does not bruise/bleed easily.  Psychiatric/Behavioral:  Negative for depression. The patient is not nervous/anxious.       PHYSICAL EXAMINATION  Patient sounds well.  In no apparent distress.  Mood and behavior are normal.  Speech is normal.  Breathing non labored.   ASSESSMENT and THERAPY PLAN:   Malignant neoplasm of upper-outer quadrant of right female breast (HCC) 08/16/2023: Right lumpectomy: 2 foci of grade 2 ILC 3.5 cm and 1.2 cm, LCIS, DCIS intermediate grade, no lymphovascular invasion, 0/5 lymph nodes, margins negative, ER 95%, PR 100%, HER2 1+ negative, Ki-67 10%  Oncotype DX score 20 (6% risk of distant recurrence at 9 years)   Treatment plan: Adjuvant radiation therapy 10/06/2023-11/02/2023: Radiation at [facility] Adjuvant antiestrogen therapy with Tamoxifen   Side effects:  Hot flashes: on Effexor  at 37.5 mg daily, ineffective, doesn't want to dose increase, will taper off. She will taper to 37.5 mg every other day the week of 8/18 (due to upcoming travel plans) She was on Fluoxetine which has a clinically significant interaction with Tamoxifen  and it is not an advisable option for her to restart Fluoxetine.   Instead, I prescribed Lexapro at 5 mg daily x 2 weeks to dose increase if tolerated to 10 mg daily.  RTC week of 07/09/2024 to touch base on how Lexapro is going and tolerance to understand refill needs.       The patient was provided an opportunity to ask questions and all were answered. The patient agreed with the plan and demonstrated an understanding of the instructions.   The patient was advised to call back or seek an in-person evaluation if the symptoms worsen or if the condition fails to improve as anticipated.   I provided 10 minutes of non  face-to-face telephone visit time during this encounter, and > 50% was spent counseling as documented under my assessment & plan.   Morna Kendall, NP 05/18/24 3:22 PM Medical Oncology and Hematology [facility] [address] [place], [place] [address] Tel. [phone]    Fax. [phone]  *Total Encounter Time as defined by the Centers for Medicare and Medicaid Services includes, in addition to the face-to-face time of a patient visit (documented in the note above) non-face-to-face time: obtaining and reviewing outside history, ordering and reviewing medications, tests or procedures, care coordination (communications with other health care professionals or caregivers) and documentation in the medical record.

## 2024-06-15 ENCOUNTER — Other Ambulatory Visit: Payer: Self-pay | Admitting: Adult Health

## 2024-06-15 ENCOUNTER — Telehealth: Payer: Self-pay

## 2024-06-15 ENCOUNTER — Encounter: Payer: Self-pay | Admitting: Adult Health

## 2024-06-15 NOTE — Telephone Encounter (Signed)
 Called pt. LVM for pt to return call back she indicated that she didn't want mobile phlebotomy because she had her draw done elsewhere. Trying to confirm if she had her labs drawn here in clinic or somewhere else.

## 2024-06-20 ENCOUNTER — Telehealth: Payer: Self-pay

## 2024-06-20 NOTE — Telephone Encounter (Signed)
 Per md orders entered for [employer] and all supported documents faxed to [phone]. Faxed confirmation was received.

## 2024-06-27 ENCOUNTER — Encounter: Payer: Self-pay | Admitting: *Deleted

## 2024-06-27 ENCOUNTER — Encounter: Payer: Self-pay | Admitting: Hematology and Oncology

## 2024-07-12 ENCOUNTER — Ambulatory Visit: Admitting: Adult Health

## 2024-07-19 ENCOUNTER — Encounter: Payer: Self-pay | Admitting: Adult Health

## 2024-07-19 ENCOUNTER — Telehealth: Admitting: Adult Health

## 2024-07-20 ENCOUNTER — Inpatient Hospital Stay: Attending: Hematology and Oncology | Admitting: Adult Health

## 2024-07-20 DIAGNOSIS — Z170 Estrogen receptor positive status [ER+]: Secondary | ICD-10-CM

## 2024-07-20 DIAGNOSIS — C50411 Malignant neoplasm of upper-outer quadrant of right female breast: Secondary | ICD-10-CM

## 2024-07-20 NOTE — Progress Notes (Addendum)
  Cancer Center Cancer Follow up:    Victoria Dannielle DEL, PA-C [address] [place] [address]   DIAGNOSIS: Cancer Staging  Malignant neoplasm of upper-outer quadrant of right female breast [facility]) Staging form: Breast, AJCC 8th Edition - Clinical: Stage IA (cT1c, cN0, cM0, G2, ER+, PR+, HER2-) - Unsigned Histologic grading system: 3 grade system - Pathologic: Stage IA (pT2, pN0, cM0, G2, ER+, PR+, HER2-) - Signed by Odean Potts, MD on 08/29/2023 Histologic grading system: 3 grade system  I connected with Victoria Garza on 07/20/24 at  9:00 AM EDT by telephone and verified that I am speaking with the correct person using two identifiers.  I discussed the limitations, risks, security and privacy concerns of performing an evaluation and management service by telephone and the availability of in person appointments.  I also discussed with the patient that there may be a patient responsible charge related to this service. The patient expressed understanding and agreed to proceed.   Patient location: home Provider location: [facility] office  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, ER 95%, PR 100%, Ki67 10%, HER2 negative   08/16/2023 Surgery   Right lumpectomy: 2 foci of grade 2 ILC 3.5 cm and 1.2 cm, LCIS, DCIS intermediate grade, no lymphovascular invasion, 0/5 lymph nodes, margins negative, ER 95%, PR 100%, HER2 1+ negative, Ki-67 10%   08/16/2023 Oncotype testing   Oncotype DX score: 20 (6% risk of distant recurrence at 9 years)   08/29/2023 Cancer  Staging   Staging form: Breast, AJCC 8th Edition - Pathologic: Stage IA (pT2, pN0, cM0, G2, ER+, PR+, HER2-) - Signed by Odean Potts, MD on 08/29/2023 Histologic grading system: 3 grade system   10/06/2023 - 11/02/2023 Radiation Therapy   R Breast 10/06/2023-10/31/2023 Tangents - Prone 266 / 266 cGy 16 / 16 4,256 / 4,256 cGy  R Breast Bst 11/02/2023-11/02/2023 3-D CRT 250 / 250 cGy 1 / 4 250 / 1,000 cGy    11/2023 -  Anti-estrogen oral therapy   20 mg Tamoxifen      CURRENT THERAPY: tamoxifen   INTERVAL HISTORY:  Discussed the use of AI scribe software for clinical note transcription with the patient, who gave verbal consent to proceed.  History of Present Illness Victoria Garza is a 53 year old female who presents with leg cramps and restless legs.  She experiences significant, painful leg cramps at night, particularly in her calves, despite nightly magnesium supplementation and increased water intake. Restless legs cause discomfort and disrupt her sleep. She started Lexapro  in August for hot flashes, which has helped, but she is concerned it may contribute to her restless legs. She takes 5 mg of Lexapro . She is also on Lomaira for weight loss and exercises about twice a week.     Patient Active Problem List   Diagnosis Date Noted   Malignant neoplasm of upper-outer quadrant of right female breast (HCC) 07/26/2023   Genetic testing 07/25/2023    is allergic to erythromycin.  MEDICAL HISTORY: Past Medical History:  Diagnosis Date   Breast cancer [facility])     SURGICAL HISTORY: Past Surgical History:  Procedure Laterality Date  BREAST BIOPSY Left 06/05/2007   BREAST BIOPSY Right 06/28/2010   BREAST LUMPECTOMY WITH RADIOACTIVE SEED AND SENTINEL LYMPH NODE BIOPSY Right 08/16/2023   Procedure: RIGHT BREAST SEED BRACKETED LUMPECTOMY, RIGHT AXILLARY SENTINEL NODE BIOPSY;  Surgeon: Ebbie Cough, MD;  Location: [facility];  Service: General;  Laterality: Right;   PEC BLOCK   THYROIDECTOMY      SOCIAL HISTORY: Social History   Socioeconomic History   Marital status: Divorced    Spouse name: Not on file   Number of children: Not on file   Years of education: Not on file   Highest education level: Not on file  Occupational History   Not on file  Tobacco Use   Smoking status: Never   Smokeless tobacco: Never  Substance and Sexual Activity   Alcohol use: Yes    Alcohol/week: 1.0 standard drink of alcohol    Types: 1 Glasses of wine per week   Drug use: Never   Sexual activity: Not on file  Other Topics Concern   Not on file  Social History Narrative   Not on file   Social Drivers of Health   Financial Resource Strain: Not on file  Food Insecurity: No Food Insecurity (07/27/2023)   Hunger Vital Sign    Worried About Running Out of Food in the Last Year: Never true    Ran Out of Food in the Last Year: Never true  Transportation Needs: No Transportation Needs (07/27/2023)   PRAPARE - Administrator, Civil Service (Medical): No    Lack of Transportation (Non-Medical): No  Physical Activity: Not on file  Stress: Not on file  Social Connections: Not on file  Intimate Partner Violence: Not At Risk (07/27/2023)   Humiliation, Afraid, Rape, and Kick questionnaire    Fear of Current or Ex-Partner: No    Emotionally Abused: No    Physically Abused: No    Sexually Abused: No    FAMILY HISTORY: Family History  Problem Relation Age of Onset   Brain cancer Father    Breast cancer Sister 24    Review of Systems  Constitutional:  Negative for appetite change, chills, fatigue, fever and unexpected weight change.  HENT:   Negative for hearing loss, lump/mass and trouble swallowing.   Eyes:  Negative for eye problems and icterus.  Respiratory:  Negative for chest tightness, cough and shortness of breath.   Cardiovascular:  Negative for chest pain, leg swelling and palpitations.  Gastrointestinal:  Negative for abdominal  distention, abdominal pain, constipation, diarrhea, nausea and vomiting.  Endocrine: Negative for hot flashes.  Genitourinary:  Negative for difficulty urinating.   Musculoskeletal:  Negative for arthralgias.  Skin:  Negative for itching and rash.  Neurological:  Negative for dizziness, extremity weakness, headaches and numbness.  Hematological:  Negative for adenopathy. Does not bruise/bleed easily.  Psychiatric/Behavioral:  Negative for depression. The patient is not nervous/anxious.       PHYSICAL EXAMINATION Sounds well In no apparent distress, mood and behavior are normal, speech normal   ASSESSMENT and THERAPY PLAN:   Assessment and Plan Assessment & Plan Restless legs syndrome and muscle cramps Leg cramps and restless legs disrupt sleep. Cramps may relate to fluid/electrolyte imbalances. - Continue magnesium supplementation. - Increase fluid intake.  Depression Lexapro  has improved mood and hot flashes. Effexor  discontinuation was challenging but managed. - Continue Lexapro  5 mg daily.  Obesity On Lomaira for weight loss. Metabolic rate assessment indicates low caloric intake requirement. - Continue Lomaira  as prescribed. - Maintain caloric intake per metabolic rate assessment.    Follow up instructions:    -Return to cancer center 08/2024 for f/u with Dr. Odean  The patient was provided an opportunity to ask questions and all were answered. The patient agreed with the plan and demonstrated an understanding of the instructions.   The patient was advised to call back or seek an in-person evaluation if the symptoms worsen or if the condition fails to improve as anticipated.   I provided 10 minutes of non face-to-face telephone visit time during this encounter, and > 50% was spent counseling as documented under my assessment & plan.   Morna Kendall, NP 07/20/24 8:59 AM Medical Oncology and Hematology [facility] [address] [place], [place]  [address] Tel. [phone]    Fax. [phone]  *Total Encounter Time as defined by the Centers for Medicare and Medicaid Services includes, in addition to the face-to-face time of a patient visit (documented in the note above) non-face-to-face time: obtaining and reviewing outside history, ordering and reviewing medications, tests or procedures, care coordination (communications with other health care professionals or caregivers) and documentation in the medical record.

## 2024-08-27 ENCOUNTER — Inpatient Hospital Stay: Attending: Hematology and Oncology | Admitting: Hematology and Oncology

## 2024-08-27 VITALS — BP 112/70 | HR 87 | Temp 97.9°F | Resp 18 | Wt 161.9 lb

## 2024-08-27 DIAGNOSIS — Z807 Family history of other malignant neoplasms of lymphoid, hematopoietic and related tissues: Secondary | ICD-10-CM | POA: Diagnosis not present

## 2024-08-27 DIAGNOSIS — Z79810 Long term (current) use of selective estrogen receptor modulators (SERMs): Secondary | ICD-10-CM | POA: Diagnosis not present

## 2024-08-27 DIAGNOSIS — Z803 Family history of malignant neoplasm of breast: Secondary | ICD-10-CM | POA: Insufficient documentation

## 2024-08-27 DIAGNOSIS — G2581 Restless legs syndrome: Secondary | ICD-10-CM | POA: Insufficient documentation

## 2024-08-27 DIAGNOSIS — F32A Depression, unspecified: Secondary | ICD-10-CM | POA: Insufficient documentation

## 2024-08-27 DIAGNOSIS — D0511 Intraductal carcinoma in situ of right breast: Secondary | ICD-10-CM | POA: Insufficient documentation

## 2024-08-27 DIAGNOSIS — Z170 Estrogen receptor positive status [ER+]: Secondary | ICD-10-CM | POA: Diagnosis not present

## 2024-08-27 DIAGNOSIS — C50411 Malignant neoplasm of upper-outer quadrant of right female breast: Secondary | ICD-10-CM

## 2024-08-27 MED ORDER — ANASTROZOLE 1 MG PO TABS: 1.0000 mg | ORAL_TABLET | Freq: Every day | ORAL | 3 refills | Status: AC

## 2024-08-27 NOTE — Assessment & Plan Note (Signed)
 08/16/2023: Right lumpectomy: 2 foci of grade 2 ILC 3.5 cm and 1.2 cm, LCIS, DCIS intermediate grade, no lymphovascular invasion, 0/5 lymph nodes, margins negative, ER 95%, PR 100%, HER2 1+ negative, Ki-67 10%  Oncotype DX score 20 (6% risk of distant recurrence at 9 years)   Treatment plan: Adjuvant radiation therapy 10/06/2023-11/02/2023: Radiation at [facility] Adjuvant antiestrogen therapy with tamoxifen  started 11/23/2023   Breast cancer surveillance: Contrast-enhanced mammograms annually.  Guardant reveal for MRD monitoring. Tamoxifen  toxicities: Restless legs and muscle cramps Depression: Currently on Lexapro  Return to clinic in 1 year for follow-up

## 2024-08-27 NOTE — Progress Notes (Signed)
 Patient Care Team: [name] as PCP - Diedre Odean Potts, MD as Consulting Physician (Hematology and Oncology) Dewey Rush, MD as Consulting Physician (Radiation Oncology) Ebbie Cough, MD as Consulting Physician (General Surgery)  DIAGNOSIS:  Encounter Diagnosis  Name Primary?   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, ER 95%, PR 100%, Ki67 10%, HER2 negative   08/16/2023 Surgery   Right lumpectomy: 2 foci of grade 2 ILC 3.5 cm and 1.2 cm, LCIS, DCIS intermediate grade, no lymphovascular invasion, 0/5 lymph nodes, margins negative, ER 95%, PR 100%, HER2 1+ negative, Ki-67 10%   08/16/2023 Oncotype testing   Oncotype DX score: 20 (6% risk of distant recurrence at 9 years)   08/29/2023 Cancer Staging   Staging form: Breast, AJCC 8th Edition - Pathologic: Stage IA (pT2, pN0, cM0, G2, ER+, PR+, HER2-) - Signed by Odean Potts, MD on 08/29/2023 Histologic grading system: 3 grade system   10/06/2023 - 11/02/2023 Radiation Therapy   R Breast 10/06/2023-10/31/2023 Tangents - Prone 266 / 266 cGy 16 / 16 4,256 / 4,256 cGy  R Breast Bst 11/02/2023-11/02/2023 3-D CRT 250 / 250 cGy 1 / 4 250 / 1,000 cGy    11/2023 -  Anti-estrogen oral therapy   20 mg Tamoxifen      CHIEF COMPLIANT: Surveillance of breast cancer on tamoxifen   HISTORY OF PRESENT ILLNESS:   History of Present Illness Victoria Garza is a 53 year old female with breast cancer  who presents for follow-up regarding her hormone therapy and recurrence risk.  She experiences hot flashes and night sweats while on tamoxifen , which have worsened in the past two weeks, affecting her sleep. She is concerned about her recurrence risk and has discussed her oncotype score and recurrence rates. Her family history includes a sister who died of breast cancer in her early thirties and had Hodgkin's lymphoma. Genetic testing is negative, and she is concerned about screening for her daughters and niece.  She notices a difference in breast size post-surgery and is considering a referral to a engineer, petroleum. Her last menstrual cycle was minor in December of the previous year, with irregular cycles before that. She has not menstruated for almost eleven months. She is considering switching to an aromatase inhibitor due to her menopausal status. She engages in weight training to support bone health.     ALLERGIES:  is allergic to erythromycin.  MEDICATIONS:  Current Outpatient Medications  Medication Sig Dispense Refill   escitalopram  (LEXAPRO ) 5 MG tablet TAKE 1 TABLET (5 MG TOTAL) BY MOUTH DAILY FOR 14 DAYS, THEN 2 TABLETS (10 MG TOTAL) DAILY FOR 14 DAYS. 126 tablet 1   levothyroxine (SYNTHROID) 125 MCG tablet Take 125 mcg by mouth daily before breakfast.     LOMAIRA 8 MG TABS Take 1 tablet by mouth every morning.     loratadine (CLARITIN) 10 MG tablet Take 10 mg by mouth daily. [employer] brand AllerClear     tamoxifen  (NOLVADEX ) 20 MG tablet Take 1 tablet (20 mg total) by mouth daily. 90 tablet  3   No current facility-administered medications for this visit.    PHYSICAL EXAMINATION: ECOG PERFORMANCE STATUS: 1 - Symptomatic but completely ambulatory  Vitals:   08/27/24 1138  BP: 112/70  Pulse: 87  Resp: 18  Temp: 97.9 F (36.6 C)  SpO2: 100%   Filed Weights   08/27/24 1138  Weight: 161 lb 14.4 oz (73.4 kg)    Physical Exam Breast exam: Benign  (exam performed in the  presence of a chaperone)  LABORATORY DATA:  I have reviewed the data as listed    Latest Ref Rng & Units 07/27/2023   12:58 PM 06/23/2018   10:57 AM  CMP  Glucose 70 - 99 mg/dL 891  93   BUN 6 - 20 mg/dL 14  16   Creatinine 9.55 - 1.00 mg/dL 9.00  9.21   Sodium 864 - 145 mmol/L 140  138   Potassium 3.5 - 5.1 mmol/L 3.6  3.8   Chloride 98 - 111 mmol/L 104  105   CO2 22 - 32 mmol/L 30  23   Calcium 8.9 - 10.3 mg/dL 9.4  8.9   Total Protein 6.5 - 8.1 g/dL 7.8  7.6   Total Bilirubin 0.3 - 1.2 mg/dL 0.5  0.9   Alkaline Phos 38 - 126 U/L 62  38   AST 15 - 41 U/L 18  20   ALT 0 - 44 U/L 15  14     Lab Results  Component Value Date   WBC 7.6 07/27/2023   HGB 14.5 07/27/2023   HCT 42.9 07/27/2023   MCV 89.2 07/27/2023   PLT 339 07/27/2023   NEUTROABS 5.0 07/27/2023    ASSESSMENT & PLAN:  Malignant neoplasm of upper-outer quadrant of right female breast (HCC) 08/16/2023: Right lumpectomy: 2 foci of grade 2 ILC 3.5 cm and 1.2 cm, LCIS, DCIS intermediate grade, no lymphovascular invasion, 0/5 lymph nodes, margins negative, ER 95%, PR 100%, HER2 1+ negative, Ki-67 10%  Oncotype DX score 20 (6% risk of distant recurrence at 9 years)   Treatment plan: Adjuvant radiation therapy 10/06/2023-11/02/2023: [facility] at [facility] Adjuvant antiestrogen therapy with tamoxifen  started 11/23/2023   Breast cancer surveillance: Contrast-enhanced mammograms annually.  Guardant reveal for MRD monitoring. Tamoxifen  toxicities: Restless legs and muscle cramps Depression: Currently on Lexapro  Since she is about a year from her last menstrual cycle, I recommended switching her from tamoxifen  to anastrozole therapy.  Anastrozole counseling: We discussed the risks and benefits of anti-estrogen therapy with aromatase inhibitors. These include but not limited to insomnia, hot flashes, mood changes, vaginal dryness, bone density loss, and weight gain. We strongly believe that the benefits far outweigh the  risks. Patient understands these risks and consented to starting treatment. Planned treatment duration is 5 years.  Albumin breast sizes: I sent a message to Dr. Arelia for a plastic surgery consult. Return to clinic in 1 year for follow-up    No orders of the defined types were placed in this encounter.  The patient has a good understanding of the overall plan. she agrees with it. she will call with any problems that may develop before the next visit here.  I personally spent a total of 30 minutes in the care of the patient today including preparing to see the patient, getting/reviewing separately obtained history, performing a medically appropriate exam/evaluation, counseling and educating, placing orders, referring and communicating with other health care professionals, documenting clinical information in the EHR, independently interpreting results, communicating results, and coordinating care.  Viinay K Sanam Marmo, MD 08/27/24

## 2024-11-14 ENCOUNTER — Encounter: Payer: Self-pay | Admitting: Hematology and Oncology

## 2024-11-22 ENCOUNTER — Encounter: Payer: Self-pay | Admitting: Dermatology

## 2024-11-28 ENCOUNTER — Inpatient Hospital Stay: Attending: Hematology and Oncology | Admitting: Hematology and Oncology

## 2025-08-28 ENCOUNTER — Inpatient Hospital Stay: Admitting: Hematology and Oncology
